# Patient Record
Sex: Female | Born: 1960
Health system: Southern US, Community
[De-identification: ages and names within clinical notes are randomized; demographics above are authoritative.]

## PROBLEM LIST (undated history)

## (undated) DIAGNOSIS — F329 Major depressive disorder, single episode, unspecified: Secondary | ICD-10-CM

## (undated) DIAGNOSIS — M1990 Unspecified osteoarthritis, unspecified site: Secondary | ICD-10-CM

## (undated) DIAGNOSIS — K219 Gastro-esophageal reflux disease without esophagitis: Secondary | ICD-10-CM

## (undated) DIAGNOSIS — F32A Depression, unspecified: Secondary | ICD-10-CM

## (undated) HISTORY — DX: Major depressive disorder, single episode, unspecified: F32.9

## (undated) HISTORY — DX: Depression, unspecified: F32.A

## (undated) HISTORY — PX: COLONOSCOPY: SHX174

## (undated) HISTORY — DX: Unspecified osteoarthritis, unspecified site: M19.90

## (undated) HISTORY — DX: Gastro-esophageal reflux disease without esophagitis: K21.9

---
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
## 2007-09-07 HISTORY — PX: ABDOMINAL HYSTERECTOMY: SHX81

## 2010-11-25 LAB — HM COLONOSCOPY

## 2012-10-29 ENCOUNTER — Emergency Department (INDEPENDENT_AMBULATORY_CARE_PROVIDER_SITE_OTHER): Payer: 59

## 2012-10-29 ENCOUNTER — Emergency Department (INDEPENDENT_AMBULATORY_CARE_PROVIDER_SITE_OTHER)
Admission: EM | Admit: 2012-10-29 | Discharge: 2012-10-29 | Disposition: A | Discharge status: Home / Self Care | Payer: 59 | Source: Home / Self Care

## 2012-10-29 ENCOUNTER — Emergency Department (HOSPITAL_COMMUNITY): Payer: 59

## 2012-10-29 ENCOUNTER — Encounter (HOSPITAL_COMMUNITY): Payer: Self-pay | Admitting: Emergency Medicine

## 2012-10-29 DIAGNOSIS — S5291XA Unspecified fracture of right forearm, initial encounter for closed fracture: Secondary | ICD-10-CM

## 2012-10-29 DIAGNOSIS — S5290XA Unspecified fracture of unspecified forearm, initial encounter for closed fracture: Secondary | ICD-10-CM

## 2012-10-29 DIAGNOSIS — S5292XA Unspecified fracture of left forearm, initial encounter for closed fracture: Secondary | ICD-10-CM

## 2012-10-29 MED ORDER — HYDROCODONE-ACETAMINOPHEN 7.5-500 MG PO TABS: 1.0000 | ORAL_TABLET | ORAL | Status: DC | PRN

## 2012-10-29 NOTE — ED Provider Notes (Signed)
History     CSN: [id number]  Arrival date & time 10/29/12  1114   First MD Initiated Contact with Patient 10/29/12 1118      Chief Complaint  Patient presents with  . Wrist Injury    fell while skating.     (Consider location/radiation/quality/duration/timing/severity/associated sxs/prior treatment) Patient is a 52 y.o. female presenting with wrist injury.  Wrist Injury  This is a 52 year old female who fell while rollerskating last night onto outstretched hands. The patient is noticing pain in both wrists today and difficulty with moving her wrist. She has not tried any medications to help alleviate the pain. The pain is worse in her right breast than her left. No other associated symptoms are present. History reviewed. No pertinent past medical history.  Past Surgical History  Procedure Laterality Date  . Abdominal hysterectomy      History reviewed. No pertinent family history.  History  Substance Use Topics  . Smoking status: Never Smoker   . Smokeless tobacco: Not on file  . Alcohol Use: No    OB History   Grav Para Term Preterm Abortions TAB SAB Ect Mult Living                  Review of Systems  Constitutional: Negative.   HENT: Negative.   Respiratory: Negative.   Cardiovascular: Negative.   Gastrointestinal: Negative.   Musculoskeletal:       History of present illness-pain in bilateral wrists.  Skin: Negative.   Neurological: Negative.   Hematological: Negative.   Psychiatric/Behavioral: Negative.     Allergies  Codeine  Home Medications   Current Outpatient Rx  Name  Route  Sig  Dispense  Refill  . HYDROcodone-acetaminophen (LORTAB 7.5) 7.5-500 MG per tablet   Oral   Take 1 tablet by mouth every 4 (four) hours as needed for pain.   30 tablet   0     BP 119/73  Pulse 85  Temp(Src) 98.5 F (36.9 C) (Oral)  Resp 19  SpO2 100%  Physical Exam  Constitutional: She is oriented to person, place, and time. She appears well-developed and  well-nourished.  HENT:  Head: Normocephalic and atraumatic.  Eyes: Pupils are equal, round, and reactive to light.  Neck: Normal range of motion.  Cardiovascular: Normal rate and regular rhythm.   Pulmonary/Chest: Effort normal and breath sounds normal.  Abdominal: Bowel sounds are normal.  Musculoskeletal:  Tenderness in the medial aspect of both wrists with excess relation of pain on lateral flexion of the hand and flexion and extension of the thumb- mild swelling at the base of the right hand medial aspect where it joins with the wrist  Neurological: She is alert and oriented to person, place, and time.  Skin: Skin is warm and dry.  Psychiatric: She has a normal mood and affect.    ED Course  Procedures (including critical care time)  Labs Reviewed - No data to display Dg Wrist Complete Left  10/29/2012  *RADIOLOGY REPORT*  Clinical Data: Bilateral wrist pain post injury  LEFT WRIST - COMPLETE 3+ VIEW  Comparison: Right wrist same day  Findings: Four views of the left wrist submitted. There is cortical step-off in distal left radius  in  lateral view.  This is highly suspicious for subtle impacted fracture.  Clinical correlation is necessary.  Further evaluation with CT scan could be performed as clinically warranted.  IMPRESSION: There is cortical step-off in distal left radius  in  lateral view. This is  highly suspicious for subtle impacted fracture.  Clinical correlation is necessary.  Further evaluation with CT scan could be performed as clinically warranted.   Original Report Authenticated By: Natasha Mead, M.D.    Dg Wrist Complete Right  10/29/2012  *RADIOLOGY REPORT*  Clinical Data: Wrist injury, bilateral wrist pain  RIGHT WRIST - COMPLETE 3+ VIEW  Comparison: None.  Findings: Four views of the right wrist submitted.  There is mild impacted fracture of the distal right radius.  Fracture line is seen extending in the articular surface of the radius.  IMPRESSION: There is mild impacted  fracture of the distal right radius. Fracture line is seen extending in the articular surface of the radius.   Original Report Authenticated By: Natasha Mead, M.D.      1. Radial fracture, left, closed, initial encounter   2. Radial fracture, right, closed, initial encounter       MDM  Bilateral sugar tong splints. Vicodin when necessary for pain. Outpatient referral to Dr. Victorino Dike.        Calvert Cantor, MD 10/29/12 1245

## 2012-10-29 NOTE — ED Notes (Signed)
Waiting ortho tech then will discharge

## 2012-10-29 NOTE — ED Notes (Signed)
Pt states that around 7:30 she was skating and fell with hands extended outward injuring both wrist.  Pt has limited ROM with right wrist and its more painful. Some pain in left wrist.

## 2013-05-24 ENCOUNTER — Ambulatory Visit (INDEPENDENT_AMBULATORY_CARE_PROVIDER_SITE_OTHER): Payer: 59 | Admitting: Licensed Clinical Social Worker

## 2013-05-24 DIAGNOSIS — F321 Major depressive disorder, single episode, moderate: Secondary | ICD-10-CM

## 2013-05-28 ENCOUNTER — Ambulatory Visit (INDEPENDENT_AMBULATORY_CARE_PROVIDER_SITE_OTHER): Payer: 59 | Admitting: Licensed Clinical Social Worker

## 2013-05-28 DIAGNOSIS — F331 Major depressive disorder, recurrent, moderate: Secondary | ICD-10-CM

## 2013-05-30 ENCOUNTER — Ambulatory Visit: Payer: 59 | Admitting: Licensed Clinical Social Worker

## 2013-06-06 ENCOUNTER — Ambulatory Visit (INDEPENDENT_AMBULATORY_CARE_PROVIDER_SITE_OTHER): Payer: 59 | Admitting: Licensed Clinical Social Worker

## 2013-06-06 DIAGNOSIS — F321 Major depressive disorder, single episode, moderate: Secondary | ICD-10-CM

## 2013-06-22 ENCOUNTER — Ambulatory Visit (INDEPENDENT_AMBULATORY_CARE_PROVIDER_SITE_OTHER): Payer: 59 | Admitting: Licensed Clinical Social Worker

## 2013-06-22 DIAGNOSIS — F321 Major depressive disorder, single episode, moderate: Secondary | ICD-10-CM

## 2013-07-13 ENCOUNTER — Ambulatory Visit: Payer: 59 | Admitting: Licensed Clinical Social Worker

## 2013-07-23 ENCOUNTER — Ambulatory Visit (INDEPENDENT_AMBULATORY_CARE_PROVIDER_SITE_OTHER): Payer: 59 | Admitting: Licensed Clinical Social Worker

## 2013-07-23 DIAGNOSIS — F321 Major depressive disorder, single episode, moderate: Secondary | ICD-10-CM

## 2013-08-13 ENCOUNTER — Ambulatory Visit (INDEPENDENT_AMBULATORY_CARE_PROVIDER_SITE_OTHER): Payer: 59 | Admitting: Licensed Clinical Social Worker

## 2013-08-13 DIAGNOSIS — F321 Major depressive disorder, single episode, moderate: Secondary | ICD-10-CM

## 2013-08-27 ENCOUNTER — Ambulatory Visit (INDEPENDENT_AMBULATORY_CARE_PROVIDER_SITE_OTHER): Payer: 59 | Admitting: Licensed Clinical Social Worker

## 2013-08-27 DIAGNOSIS — F321 Major depressive disorder, single episode, moderate: Secondary | ICD-10-CM

## 2013-09-24 ENCOUNTER — Ambulatory Visit (INDEPENDENT_AMBULATORY_CARE_PROVIDER_SITE_OTHER): Payer: 59 | Admitting: Licensed Clinical Social Worker

## 2013-09-24 DIAGNOSIS — F321 Major depressive disorder, single episode, moderate: Secondary | ICD-10-CM

## 2013-10-17 ENCOUNTER — Ambulatory Visit: Payer: 59 | Admitting: Licensed Clinical Social Worker

## 2014-05-29 ENCOUNTER — Ambulatory Visit: Payer: Self-pay | Admitting: Internal Medicine

## 2014-07-09 ENCOUNTER — Ambulatory Visit: Payer: Self-pay | Admitting: Internal Medicine

## 2014-08-21 ENCOUNTER — Ambulatory Visit (INDEPENDENT_AMBULATORY_CARE_PROVIDER_SITE_OTHER): Payer: 59 | Admitting: Internal Medicine

## 2014-08-21 ENCOUNTER — Encounter (INDEPENDENT_AMBULATORY_CARE_PROVIDER_SITE_OTHER): Payer: Self-pay

## 2014-08-21 ENCOUNTER — Encounter: Payer: Self-pay | Admitting: Internal Medicine

## 2014-08-21 VITALS — BP 112/84 | HR 63 | Temp 97.9°F | Ht 68.75 in | Wt 197.0 lb

## 2014-08-21 DIAGNOSIS — K219 Gastro-esophageal reflux disease without esophagitis: Secondary | ICD-10-CM

## 2014-08-21 DIAGNOSIS — Z23 Encounter for immunization: Secondary | ICD-10-CM

## 2014-08-21 DIAGNOSIS — F419 Anxiety disorder, unspecified: Secondary | ICD-10-CM | POA: Insufficient documentation

## 2014-08-21 DIAGNOSIS — F32A Depression, unspecified: Secondary | ICD-10-CM

## 2014-08-21 DIAGNOSIS — F329 Major depressive disorder, single episode, unspecified: Secondary | ICD-10-CM

## 2014-08-21 MED ORDER — OMEPRAZOLE 40 MG PO CPDR: 40.0000 mg | DELAYED_RELEASE_CAPSULE | Freq: Every day | ORAL | Status: DC

## 2014-08-21 MED ORDER — MELOXICAM 15 MG PO TABS: 15.0000 mg | ORAL_TABLET | Freq: Every day | ORAL | Status: DC

## 2014-08-21 NOTE — Assessment & Plan Note (Signed)
Will increase her prilosec to 40 mg daily to see if symptoms improve Ok to take TUMS otc if needed Will check CMET at next visit

## 2014-08-21 NOTE — Assessment & Plan Note (Signed)
Stable on current dose of prozac and klonipin Continue to follow with psychiatry Support offered today Will check CBC and CMET at next visit

## 2014-08-21 NOTE — Progress Notes (Signed)
HPI  Pt presents to the clinic today to establish care. She is transferring care from [facility].  Flu: wants one today Tetanus: ? 2014 LMP: postmenopausal Pap Smear: 2011? Mammogram: 12/2012 Colon Screening: 2012 Vision Screening: yearly Dentist: biannually  GERD: She is having breakthrouh symptoms on the Prilosec 20 mg daily. She knows that certain foods trigger her reflux and she tries to avoid them. She continues to have reflux about 3 days per week. She thinks she may need to increase her dose.  Depression: Chronic but stable She is seeing psychiatriy. She is currently on Prozac and Klonipin. She reports that it is working well. She denies SI/HI.  She does have some concerns today about low back pain. She reports this started many years ago but seems to be getting worse over the last 6 months. She wakes up with back pain every morning. It also hurts if she stands or walks for long periods of time. She denies numbness and tingling down the legs. She describes the pain as a burning sensation. She denies any injury. She has tried Ibuprofen and Aspercream with some relief. She does have a brand new mattress.  Past Medical History  Diagnosis Date  . GERD (gastroesophageal reflux disease)   . Depression     Current Outpatient Prescriptions  Medication Sig Dispense Refill  . clonazePAM (KLONOPIN) 0.5 MG tablet Take 1 mg by mouth daily.   5  . FLUoxetine (PROZAC) 20 MG capsule Take 20 mg by mouth daily.  12  . omeprazole (PRILOSEC) 40 MG capsule Take 40 mg by mouth daily.     No current facility-administered medications for this visit.    Allergies  Allergen Reactions  . Codeine Nausea Only    Family History  Problem Relation Age of Onset  . Arthritis Mother   . Hyperlipidemia Mother   . Heart disease Mother   . Hypertension Mother   . Anxiety disorder Mother   . Diabetes Mother   . Alcohol abuse Father   . Arthritis Father   . Mental illness Father   . Diabetes  Sister   . Diabetes Brother   . Diabetes Maternal Aunt     History   Social History  . Marital Status: Married    Spouse Name: N/A    Number of Children: N/A  . Years of Education: N/A   Occupational History  . Not on file.   Social History Main Topics  . Smoking status: Never Smoker   . Smokeless tobacco: Never Used  . Alcohol Use: No  . Drug Use: No  . Sexual Activity: Yes    Birth Control/ Protection: Condom, Surgical   Other Topics Concern  . Not on file   Social History Narrative    ROS:  Constitutional: Denies fever, malaise, fatigue, headache or abrupt weight changes.  Respiratory: Denies difficulty breathing, shortness of breath, cough or sputum production.   Cardiovascular: Denies chest pain, chest tightness, palpitations or swelling in the hands or feet.  Gastrointestinal: Pt reports reflux. Denies abdominal pain, bloating, constipation, diarrhea or blood in the stool.  GU: Denies frequency, urgency, pain with urination, blood in urine, odor or discharge. Musculoskeletal: Pt reports low back pain. Denies decrease in range of motion, difficulty with gait, or joint pain and swelling.  Skin: Denies redness, rashes, lesions or ulcercations.  Neurological: Denies dizziness, difficulty with memory, difficulty with speech or problems with balance and coordination.   No other specific complaints in a complete review of systems (except  as listed in HPI above).  PE:  BP 112/84 mmHg  Pulse 63  Temp(Src) 97.9 F (36.6 C) (Oral)  Ht 5' 8.75" (1.746 m)  Wt 197 lb (89.359 kg)  BMI 29.31 kg/m2  SpO2 99% Wt Readings from Last 3 Encounters:  08/21/14 197 lb (89.359 kg)    General: Appears her stated age, well developed, well nourished in NAD. Cardiovascular: Normal rate and rhythm. S1,S2 noted.  No murmur, rubs or gallops noted. No carotid bruits noted. Pulmonary/Chest: Normal effort and positive vesicular breath sounds. No respiratory distress. No wheezes, rales or  ronchi noted.  Abdomen: Soft and nontender. Normal bowel sounds, no bruits noted. No distention or masses noted.  Musculoskeletal: Normal flexion, extension and rotation of the spine. No pain with palpation fo the lumbar spine. Pain with palpation of the paraspinal muscles. Strength 5/5 BLE. No difficulty with gait.  Neurological: Alert and oriented.  Psychiatric: Mood and affect normal. Behavior is normal. Judgment and thought content normal.     Assessment and Plan:  Health Maintenance:  She will schedule her mammogram Flu shot today  Low back pain:  Seems muscular Try a heating pad eRx for Mobic daily OK to take tylenol OTC if needed  RTC in 6 months for physical exam

## 2014-08-21 NOTE — Patient Instructions (Signed)
Back Exercises These exercises may help you when beginning to rehabilitate your injury. Your symptoms may resolve with or without further involvement from your physician, physical therapist or athletic trainer. While completing these exercises, remember:   Restoring tissue flexibility helps normal motion to return to the joints. This allows healthier, less painful movement and activity.  An effective stretch should be held for at least 30 seconds.  A stretch should never be painful. You should only feel a gentle lengthening or release in the stretched tissue. STRETCH - Extension, Prone on Elbows   Lie on your stomach on the floor, a bed will be too soft. Place your palms about shoulder width apart and at the height of your head.  Place your elbows under your shoulders. If this is too painful, stack pillows under your chest.  Allow your body to relax so that your hips drop lower and make contact more completely with the floor.  Hold this position for __________ seconds.  Slowly return to lying flat on the floor. Repeat __________ times. Complete this exercise __________ times per day.  RANGE OF MOTION - Extension, Prone Press Ups   Lie on your stomach on the floor, a bed will be too soft. Place your palms about shoulder width apart and at the height of your head.  Keeping your back as relaxed as possible, slowly straighten your elbows while keeping your hips on the floor. You may adjust the placement of your hands to maximize your comfort. As you gain motion, your hands will come more underneath your shoulders.  Hold this position __________ seconds.  Slowly return to lying flat on the floor. Repeat __________ times. Complete this exercise __________ times per day.  RANGE OF MOTION- Quadruped, Neutral Spine   Assume a hands and knees position on a firm surface. Keep your hands under your shoulders and your knees under your hips. You may place padding under your knees for  comfort.  Drop your head and point your tail bone toward the ground below you. This will round out your low back like an angry cat. Hold this position for __________ seconds.  Slowly lift your head and release your tail bone so that your back sags into a large arch, like an old horse.  Hold this position for __________ seconds.  Repeat this until you feel limber in your low back.  Now, find your "sweet spot." This will be the most comfortable position somewhere between the two previous positions. This is your neutral spine. Once you have found this position, tense your stomach muscles to support your low back.  Hold this position for __________ seconds. Repeat __________ times. Complete this exercise __________ times per day.  STRETCH - Flexion, Single Knee to Chest   Lie on a firm bed or floor with both legs extended in front of you.  Keeping one leg in contact with the floor, bring your opposite knee to your chest. Hold your leg in place by either grabbing behind your thigh or at your knee.  Pull until you feel a gentle stretch in your low back. Hold __________ seconds.  Slowly release your grasp and repeat the exercise with the opposite side. Repeat __________ times. Complete this exercise __________ times per day.  STRETCH - Hamstrings, Standing  Stand or sit and extend your right / left leg, placing your foot on a chair or foot stool  Keeping a slight arch in your low back and your hips straight forward.  Lead with your chest and   lean forward at the waist until you feel a gentle stretch in the back of your right / left knee or thigh. (When done correctly, this exercise requires leaning only a small distance.)  Hold this position for __________ seconds. Repeat __________ times. Complete this stretch __________ times per day. STRENGTHENING - Deep Abdominals, Pelvic Tilt   Lie on a firm bed or floor. Keeping your legs in front of you, bend your knees so they are both pointed  toward the ceiling and your feet are flat on the floor.  Tense your lower abdominal muscles to press your low back into the floor. This motion will rotate your pelvis so that your tail bone is scooping upwards rather than pointing at your feet or into the floor.  With a gentle tension and even breathing, hold this position for __________ seconds. Repeat __________ times. Complete this exercise __________ times per day.  STRENGTHENING - Abdominals, Crunches   Lie on a firm bed or floor. Keeping your legs in front of you, bend your knees so they are both pointed toward the ceiling and your feet are flat on the floor. Cross your arms over your chest.  Slightly tip your chin down without bending your neck.  Tense your abdominals and slowly lift your trunk high enough to just clear your shoulder blades. Lifting higher can put excessive stress on the low back and does not further strengthen your abdominal muscles.  Control your return to the starting position. Repeat __________ times. Complete this exercise __________ times per day.  STRENGTHENING - Quadruped, Opposite UE/LE Lift   Assume a hands and knees position on a firm surface. Keep your hands under your shoulders and your knees under your hips. You may place padding under your knees for comfort.  Find your neutral spine and gently tense your abdominal muscles so that you can maintain this position. Your shoulders and hips should form a rectangle that is parallel with the floor and is not twisted.  Keeping your trunk steady, lift your right hand no higher than your shoulder and then your left leg no higher than your hip. Make sure you are not holding your breath. Hold this position __________ seconds.  Continuing to keep your abdominal muscles tense and your back steady, slowly return to your starting position. Repeat with the opposite arm and leg. Repeat __________ times. Complete this exercise __________ times per day. Document Released:  09/10/2005 Document Revised: 11/15/2011 Document Reviewed: 12/05/2008 Exit[employer] Patient Information 2015 [employer]. This information is not intended to replace advice given to you by your health care provider. Make sure you discuss any questions you have with your health care provider.  

## 2014-08-21 NOTE — Progress Notes (Signed)
Pre visit review using our clinic review tool, if applicable. No additional management support is needed unless otherwise documented below in the visit note. 

## 2014-10-28 ENCOUNTER — Encounter (HOSPITAL_COMMUNITY): Payer: Self-pay

## 2014-10-28 ENCOUNTER — Emergency Department (INDEPENDENT_AMBULATORY_CARE_PROVIDER_SITE_OTHER)
Admission: EM | Admit: 2014-10-28 | Discharge: 2014-10-28 | Disposition: A | Discharge status: Home / Self Care | Payer: 59 | Source: Home / Self Care | Attending: Family Medicine | Admitting: Family Medicine

## 2014-10-28 DIAGNOSIS — M5010 Cervical disc disorder with radiculopathy, unspecified cervical region: Secondary | ICD-10-CM

## 2014-10-28 MED ORDER — METHYLPREDNISOLONE 4 MG PO KIT: PACK | ORAL | Status: DC

## 2014-10-28 MED ORDER — KETOROLAC TROMETHAMINE 30 MG/ML IJ SOLN
INTRAMUSCULAR | Status: AC
  Filled 2014-10-28: qty 1

## 2014-10-28 MED ORDER — KETOROLAC TROMETHAMINE 30 MG/ML IJ SOLN
30.0000 mg | Freq: Once | INTRAMUSCULAR | Status: AC
  Administered 2014-10-28: 30 mg via INTRAMUSCULAR

## 2014-10-28 MED ORDER — CYCLOBENZAPRINE HCL 5 MG PO TABS: 5.0000 mg | ORAL_TABLET | Freq: Three times a day (TID) | ORAL | Status: DC | PRN

## 2014-10-28 NOTE — ED Provider Notes (Signed)
CSN: [id number]     Arrival date & time 10/28/14  89 History   First MD Initiated Contact with Patient 10/28/14 1821     Chief Complaint  Patient presents with  . Neck Pain   (Consider location/radiation/quality/duration/timing/severity/associated sxs/prior Treatment) Patient is a 54 y.o. female presenting with neck pain. The history is provided by the patient.  Neck Pain Pain location:  Generalized neck Quality:  Stabbing and shooting Pain radiates to:  L hand Pain severity:  Moderate Onset quality:  Gradual Duration:  2 weeks Progression:  Worsening Chronicity:  Chronic Context comment:  Known ddd, followed by [facility], has home traction, no surgery. Relieved by:  None tried Associated symptoms: no bladder incontinence, no bowel incontinence, no fever, no headaches, no leg pain, no paresis, no tingling and no weakness     Past Medical History  Diagnosis Date  . GERD (gastroesophageal reflux disease)   . Depression    Past Surgical History  Procedure Laterality Date  . Abdominal hysterectomy  2009  . Appendectomy  1978  . Eye surgery Right 1976   Family History  Problem Relation Age of Onset  . Arthritis Mother   . Hyperlipidemia Mother   . Heart disease Mother   . Hypertension Mother   . Anxiety disorder Mother   . Diabetes Mother   . Alcohol abuse Father   . Arthritis Father   . Mental illness Father   . Diabetes Sister   . Diabetes Brother   . Diabetes Maternal Aunt    History  Substance Use Topics  . Smoking status: Never Smoker   . Smokeless tobacco: Never Used  . Alcohol Use: No   OB History    No data available     Review of Systems  Constitutional: Negative.  Negative for fever.  Gastrointestinal: Negative for bowel incontinence.  Genitourinary: Negative for bladder incontinence.  Musculoskeletal: Positive for neck pain. Negative for back pain, gait problem and neck stiffness.  Neurological: Negative for tingling, weakness and headaches.     Allergies  Codeine  Home Medications   Prior to Admission medications   Medication Sig Start Date End Date Taking? Authorizing Provider  clonazePAM (KLONOPIN) 0.5 MG tablet Take 1 mg by mouth daily.  07/12/14   Historical Provider, MD  cyclobenzaprine (FLEXERIL) 5 MG tablet Take 1 tablet (5 mg total) by mouth 3 (three) times daily as needed for muscle spasms. 10/28/14   Billy Fischer, MD  FLUoxetine (PROZAC) 20 MG capsule Take 20 mg by mouth daily. 08/10/14   Historical Provider, MD  meloxicam (MOBIC) 15 MG tablet Take 1 tablet (15 mg total) by mouth daily. 08/21/14   Jearld Fenton, NP  methylPREDNISolone (MEDROL DOSEPAK) 4 MG tablet follow package directions, start on tues 10/28/14   Billy Fischer, MD  omeprazole (PRILOSEC) 40 MG capsule Take 1 capsule (40 mg total) by mouth daily. 08/21/14   Jearld Fenton, NP   BP 142/96 mmHg  Pulse 69  Temp(Src) 98.1 F (36.7 C) (Oral)  Resp 18  SpO2 100% Physical Exam  Constitutional: She is oriented to person, place, and time. She appears well-developed and well-nourished.  Musculoskeletal: She exhibits tenderness.       Cervical back: She exhibits decreased range of motion, tenderness, bony tenderness, pain and spasm. She exhibits no swelling, no edema and normal pulse.  Neurological: She is alert and oriented to person, place, and time. She displays normal reflexes. No cranial nerve deficit. Coordination normal.  Skin: Skin  is warm and dry.  Nursing note and vitals reviewed.   ED Course  Procedures (including critical care time) Labs Review Labs Reviewed - No data to display  Imaging Review No results found.   MDM   1. Cervical disc disorder with radiculopathy of cervical region        Billy Fischer, MD 10/28/14 [id number]

## 2014-10-28 NOTE — Discharge Instructions (Signed)
Use medicine as prescribed and see your doctor if further problems

## 2014-10-28 NOTE — ED Notes (Signed)
Patient complains of having a pinched nerve that is affecting her neck Complains of neck pain and pain to her left arm Denies any chest pain Patient states she does have degenerative disc disease Symptoms started about two weeks ago Has a home traction unit but not able to get into it Using OTC medications with little relief

## 2014-10-29 ENCOUNTER — Ambulatory Visit (INDEPENDENT_AMBULATORY_CARE_PROVIDER_SITE_OTHER): Payer: 59 | Admitting: Internal Medicine

## 2014-10-29 ENCOUNTER — Encounter: Payer: Self-pay | Admitting: Internal Medicine

## 2014-10-29 VITALS — BP 120/70 | HR 78 | Temp 97.9°F | Wt 205.0 lb

## 2014-10-29 DIAGNOSIS — M5412 Radiculopathy, cervical region: Secondary | ICD-10-CM

## 2014-10-29 MED ORDER — HYDROCODONE-ACETAMINOPHEN 10-325 MG PO TABS
1.0000 | ORAL_TABLET | Freq: Three times a day (TID) | ORAL | Status: DC | PRN
Start: 2014-10-29 — End: 2015-03-19

## 2014-10-29 NOTE — Patient Instructions (Signed)

## 2014-10-29 NOTE — Progress Notes (Signed)
Subjective:    Patient ID: Katie Townsend, female    DOB: 19-Mar-1961, 54 y.o.   MRN: [id number]  HPI  Pt presents to the clinic today for ER followup of neck pain. She had a noted history of arthritis in the cervical spine. She does follow with an orthopedist but has not seen him in the last 3 months and does not have an upcoming appt. She also has traction at home that helps her, but reports she was unable to get in it. No xrays were done in the ER. She has had a xray of her cervical spine from 1 year ago (done by her orthopedist). She was given a pred pack and flexeril in the ER. She has noticed some improvement in her symptoms, but reports the pain continues to be severe at times. She describes the pain and sharp, shooting and tingling.  Review of Systems      Past Medical History  Diagnosis Date  . GERD (gastroesophageal reflux disease)   . Depression     Current Outpatient Prescriptions  Medication Sig Dispense Refill  . clonazePAM (KLONOPIN) 0.5 MG tablet Take 1 mg by mouth daily.   5  . cyclobenzaprine (FLEXERIL) 5 MG tablet Take 1 tablet (5 mg total) by mouth 3 (three) times daily as needed for muscle spasms. 30 tablet 0  . FLUoxetine (PROZAC) 20 MG capsule Take 20 mg by mouth daily.  12  . meloxicam (MOBIC) 15 MG tablet Take 1 tablet (15 mg total) by mouth daily. 90 tablet 1  . methylPREDNISolone (MEDROL DOSEPAK) 4 MG tablet follow package directions, start on tues 21 tablet 0  . omeprazole (PRILOSEC) 40 MG capsule Take 1 capsule (40 mg total) by mouth daily. 90 capsule 1   No current facility-administered medications for this visit.    Allergies  Allergen Reactions  . Codeine Nausea Only    Family History  Problem Relation Age of Onset  . Arthritis Mother   . Hyperlipidemia Mother   . Heart disease Mother   . Hypertension Mother   . Anxiety disorder Mother   . Diabetes Mother   . Alcohol abuse Father   . Arthritis Father   . Mental illness Father   . Diabetes  Sister   . Diabetes Brother   . Diabetes Maternal Aunt     History   Social History  . Marital Status: Married    Spouse Name: N/A  . Number of Children: N/A  . Years of Education: N/A   Occupational History  . Not on file.   Social History Main Topics  . Smoking status: Never Smoker   . Smokeless tobacco: Never Used  . Alcohol Use: No  . Drug Use: No  . Sexual Activity: Yes    Birth Control/ Protection: Condom, Surgical   Other Topics Concern  . Not on file   Social History Narrative     Constitutional: Denies fever, malaise, fatigue, headache or abrupt weight changes.  Respiratory: Denies difficulty breathing, shortness of breath, cough or sputum production.   Cardiovascular: Denies chest pain, chest tightness, palpitations or swelling in the hands or feet.  Musculoskeletal: Pt reports neck pain. Denies decrease in range of motion, difficulty with gait, or joint swelling.  Skin: Denies redness, rashes, lesions or ulcercations.  Neurological: Pt reports tingling in left arm. Denies dizziness, difficulty with memory, difficulty with speech or problems with balance and coordination.   No other specific complaints in a complete review of systems (except as listed  in HPI above).  Objective:   Physical Exam  BP 120/70 mmHg  Pulse 78  Temp(Src) 97.9 F (36.6 C) (Oral)  Wt 205 lb (92.987 kg)  SpO2 99% Wt Readings from Last 3 Encounters:  10/29/14 205 lb (92.987 kg)  08/21/14 197 lb (89.359 kg)    General: Appears herstated age, well developed, well nourished in NAD. Skin: Warm, dry and intact. No rashes, lesions or ulcerations noted. Cardiovascular: Normal rate and rhythm. S1,S2 noted.  No murmur, rubs or gallops noted. No JVD or BLE edema. No carotid bruits noted. Pulmonary/Chest: Normal effort and positive vesicular breath sounds. No respiratory distress. No wheezes, rales or ronchi noted.  Musculoskeletal: Normal flexion and rotation of the neck. Unable to  extend due to pain. No pain with palpation of the cervical spine. Strength 5/5 BUE.  Neurological: Alert and oriented. Sensation intact to BUE. Coordination normal.       Assessment & Plan:   ER followup for cervical radiculitis:  Hospital notes reviewed No imaging was done Some improvement with flexeril and pred pack-advised her to continue She request something for the severe pain (tramadol does not work for her) Careers information officer given for [employer] 5-325 mg TID prn, # 20 ) refills Advised her to make a follow up appt with her orthopedist, if symptoms persist, will need MRI cervical spine  RTC as needed or if symptoms persist or worsen

## 2014-10-29 NOTE — Progress Notes (Signed)
Pre visit review using our clinic review tool, if applicable. No additional management support is needed unless otherwise documented below in the visit note. 

## 2015-02-07 ENCOUNTER — Telehealth: Payer: Self-pay

## 2015-02-07 NOTE — Telephone Encounter (Signed)
Pt states she has not yet scheduled and she had previous mammogram at [facility]. Phone number given to and requested if she could let us know once she has completed the mammogram so we can make sure we get records

## 2015-02-16 ENCOUNTER — Emergency Department
Admission: EM | Admit: 2015-02-16 | Discharge: 2015-02-16 | Discharge status: Against Medical Advice | Payer: 59 | Attending: Emergency Medicine | Admitting: Emergency Medicine

## 2015-02-16 ENCOUNTER — Emergency Department: Payer: 59

## 2015-02-16 ENCOUNTER — Other Ambulatory Visit: Payer: Self-pay

## 2015-02-16 DIAGNOSIS — R0789 Other chest pain: Secondary | ICD-10-CM | POA: Diagnosis not present

## 2015-02-16 LAB — TROPONIN I: Troponin I: <0.03 ng/mL (ref <0.031)

## 2015-02-16 LAB — BASIC METABOLIC PANEL
ANION GAP: 6 (ref 5–15)
BUN: 14 mg/dL (ref 6–20)
CHLORIDE: 107 mmol/L (ref 101–111)
CO2: 25 mmol/L (ref 22–32)
CREATININE: 0.68 mg/dL (ref 0.44–1.00)
Calcium: 8.3 mg/dL — ABNORMAL LOW (ref 8.9–10.3)
GFR calc Af Amer: >60 mL/min (ref >60)
GFR calc non Af Amer: >60 mL/min (ref >60)
Glucose, Bld: 102 mg/dL — ABNORMAL HIGH (ref 65–99)
Potassium: 3.6 mmol/L (ref 3.5–5.1)
Sodium: 138 mmol/L (ref 135–145)

## 2015-02-16 LAB — CBC
HCT: 38.3 % (ref 35.0–47.0)
Hemoglobin: 12.9 g/dL (ref 12.0–16.0)
MCH: 31.9 pg (ref 26.0–34.0)
MCHC: 33.8 g/dL (ref 32.0–36.0)
MCV: 94.4 fL (ref 80.0–100.0)
Platelets: 218 10*3/uL (ref 150–440)
RBC: 4.05 MIL/uL (ref 3.80–5.20)
RDW: 13 % (ref 11.5–14.5)
WBC: 9.9 10*3/uL (ref 3.6–11.0)

## 2015-02-16 NOTE — ED Notes (Signed)
Pt reports stabbing chest pain starting Friday and worsening. Pt denies other symptoms.

## 2015-02-16 NOTE — ED Notes (Signed)
Pt left AMA. Pt seen leaving the ED.

## 2015-02-16 NOTE — ED Notes (Signed)
Pt presents to ER stating central CP for 3 days. Pt in NAD. Pt states sharp pain. Resp even and unlabored.

## 2015-03-19 ENCOUNTER — Ambulatory Visit (INDEPENDENT_AMBULATORY_CARE_PROVIDER_SITE_OTHER): Payer: 59 | Admitting: Internal Medicine

## 2015-03-19 ENCOUNTER — Encounter: Payer: Self-pay | Admitting: Internal Medicine

## 2015-03-19 VITALS — BP 110/68 | HR 78 | Temp 98.1°F | Wt 203.0 lb

## 2015-03-19 DIAGNOSIS — L989 Disorder of the skin and subcutaneous tissue, unspecified: Secondary | ICD-10-CM | POA: Diagnosis not present

## 2015-03-19 NOTE — Patient Instructions (Signed)
Excision of Skin Lesions  Excision of a skin lesion refers to the removal of a section of skin by making small cuts (incisions) in the skin. This is typically done to remove a cancerous growth (basal cell carcinoma, squamous cell carcinoma, or melanoma) or a noncancerous growth (cyst). It may be done to treat or prevent cancer or infection. It may also be done to improve cosmetic appearance (removal of mole, skin tag).  LET YOUR CAREGIVER KNOW ABOUT:   · Allergies to food or medicine.  · Medicines taken, including vitamins, herbs, eyedrops, over-the-counter medicines, and creams.  · Use of steroids (by mouth or creams).  · Previous problems with anesthetics or numbing medicines.  · History of bleeding problems or blood clots.  · History of any prostheses.  · Previous surgery.  · Other health problems, including diabetes and kidney problems.  · Possibility of pregnancy, if this applies.  RISKS AND COMPLICATIONS   Many complications can be managed. With appropriate treatment and rehabilitation, the following complications are very uncommon:  · Bleeding.  · Infection.  · Scarring.  · Recurrence of cyst or cancer.  · Changes in skin sensation or appearance (discoloration, swelling).  · Reaction to anesthesia.  · Allergic reaction to surgical materials or ointments.  · Damage to nerves, blood vessels, muscles, or other structures.  · Continued pain.  BEFORE THE PROCEDURE   It is important to follow your caregiver's instructions prior to your procedure to avoid complications. Steps before your procedure may include:  · Physical exam, blood tests, other procedures, such as removing a small sample for examination under a microscope (biopsy).  · Your caregiver may review the procedure, the anesthesia being used, and what to expect after the procedure with you.  You may be asked to:  · Stop taking certain medicines, such as blood thinners (including aspirin, clopidogrel, ibuprofen), for several days prior to your  procedure.  · Take certain medicines.  · Stop smoking.  It is a good idea to arrange for a ride home after surgery and to have someone to help you with activities during recovery.  PROCEDURE   There are several excision techniques. The type of excision or surgical technique used will depend on your condition, the location of the lesion, and your overall health. After the lesion is sterilized and a local anesthetic is applied, the following may be performed:  Complete surgical excision  The area to be removed is marked with a pen. Using a small scalpel and scissors, the surgeon gently cuts around and under the lesion until it is completely removed. The lesion is placed in a special fluid and sent to the lab for examination. If necessary, bleeding will be controlled with a device that delivers heat. The edges of the wound are stitched together and a dressing is applied. This procedure may be performed to treat a cancerous growth or noncancerous cyst or lesion. Surgeons commonly perform an elliptical excision, to minimize scarring.  Excision of a cyst  The surgeon makes an incision on the cyst. The entire cyst is removed through the incision. The wound may be closed with a suture (stitch).  Shave excision  During shave excision, the surgeon uses a small blade or loop instrument to shave off the lesion. This may be done to remove a mole or skin tag. The wound is usually left to heal on its own without stitches.  Punch excision  During punch excision, the surgeon uses a small, round tool (like a cookie   cutter) to cut a circle shape out of the skin. The outer edges of the skin are stitched together. This may be done to remove a mole or scar or to perform a biopsy of the lesion.  Mohs micrographic surgery  During Mohs micrographic surgery, layers of the lesion are removed with a scalpel or loop instrument and immediately examined under a microscope until all of the abnormal or cancerous tissue is removed. This procedure is  minimally invasive and ensures the best cosmetic outcome, with removal of as little normal tissue as possible. Mohs is usually done to treat skin cancer, such as basal cell carcinoma or squamous cell carcinoma, particularly on the face and ears.  Antibiotic ointment is applied to the surgical area after each of the procedures listed above, as necessary.  AFTER THE PROCEDURE   How well you heal depends on many factors. Most patients heal quite well with proper techniques and self-care. Scarring will lessen over time.  HOME CARE INSTRUCTIONS   · Take medicines for pain as directed.  · Keep the incision area clean, dry, and protected for at least 48 hours. Change dressings as directed.  · For bleeding, apply gentle but firm pressure to the wound using a folded towel for 20 minutes. Call your caregiver if bleeding does not stop.  · Avoid high-impact exercise and activities until the stitches are removed or the area heals.  · Follow your caregiver's instructions to minimize scarring. Avoid sun exposure until the area has healed. Scarring should lessen over time.  · Follow up with your caregiver as directed. Removal of stitches within 4 to 14 days may be necessary.  Finding out the results of your test  Not all test results are available during your visit. If your test results are not back during the visit, make an appointment with your caregiver to find out the results. Do not assume everything is normal if you have not heard from your caregiver or the medical facility. It is important for you to follow up on all of your test results.  SEEK MEDICAL CARE IF:   · You or your child has an oral temperature above 102° F (38.9° C).  · You develop signs of infection (chills, feeling unwell).  · You notice bleeding, pain, discharge, redness, or swelling at the incision site.  · You notice skin irregularities or changes in sensation.  MAKE SURE YOU:   · Understand these instructions.  · Will watch your condition.  · Will get help  right away if you are not doing well or get worse.  FOR MORE INFORMATION   American Academy of Family Physicians: www.aafp.org  American Academy of Dermatology: www.aad.org  Document Released: 11/17/2009 Document Revised: 11/15/2011 Document Reviewed: 11/17/2009  Exit[employer]® Patient Information ©2015 [employer]. This information is not intended to replace advice given to you by your health care provider. Make sure you discuss any questions you have with your health care provider.

## 2015-03-19 NOTE — Progress Notes (Signed)
Pre visit review using our clinic review tool, if applicable. No additional management support is needed unless otherwise documented below in the visit note. 

## 2015-03-19 NOTE — Progress Notes (Signed)
Subjective:    Patient ID: Katie Townsend, female    DOB: 10-30-1960, 54 y.o.   MRN: [id number]  HPI  Pt presents to the clinic today with c/o a skin lesion on the left side of her face. She noticed this 2-3 months ago. A similar lesion came up just under it 2-3 weeks ago. It is a little red. It has not grown in size. It does not itch or burn. She has not tried anything OTC. She has never had any skin cancer before. She reports that she does not get a lot of sun exposure. She does wear sunscreen.  Review of Systems      Past Medical History  Diagnosis Date  . GERD (gastroesophageal reflux disease)   . Depression     Current Outpatient Prescriptions  Medication Sig Dispense Refill  . clonazePAM (KLONOPIN) 0.5 MG tablet Take 1 mg by mouth daily.   5  . esomeprazole (NEXIUM) 20 MG capsule Take 20 mg by mouth daily at 12 noon.     No current facility-administered medications for this visit.    Allergies  Allergen Reactions  . Codeine Nausea Only    Family History  Problem Relation Age of Onset  . Arthritis Mother   . Hyperlipidemia Mother   . Heart disease Mother   . Hypertension Mother   . Anxiety disorder Mother   . Diabetes Mother   . Alcohol abuse Father   . Arthritis Father   . Mental illness Father   . Diabetes Sister   . Diabetes Brother   . Diabetes Maternal Aunt     History   Social History  . Marital Status: Married    Spouse Name: N/A  . Number of Children: N/A  . Years of Education: N/A   Occupational History  . Not on file.   Social History Main Topics  . Smoking status: Never Smoker   . Smokeless tobacco: Never Used  . Alcohol Use: No  . Drug Use: No  . Sexual Activity: Yes    Birth Control/ Protection: Condom, Surgical   Other Topics Concern  . Not on file   Social History Narrative     Constitutional: Denies fever, malaise, fatigue, headache or abrupt weight changes.  Respiratory: Denies difficulty breathing, shortness of breath,  cough or sputum production.   Cardiovascular: Denies chest pain, chest tightness, palpitations or swelling in the hands or feet.  Skin: Pt reports skin lesion of face. Denies redness, rashes, lesions or ulcercations.    No other specific complaints in a complete review of systems (except as listed in HPI above).  Objective:   Physical Exam   BP 110/68 mmHg  Pulse 78  Temp(Src) 98.1 F (36.7 C) (Oral)  Wt 203 lb (92.08 kg)  SpO2 98% Wt Readings from Last 3 Encounters:  03/19/15 203 lb (92.08 kg)  02/16/15 200 lb (90.719 kg)  10/29/14 205 lb (92.987 kg)    General: Appears her stated age, well developed, well nourished in NAD. Skin: Warm, dry and intact. Small irregularly shaped lesion to bridge of nose on left side. The lesion is raised and scaly. Cardiovascular: Normal rate and rhythm. S1,S2 noted.  No murmur, rubs or gallops noted.  Pulmonary/Chest: Normal effort and positive vesicular breath sounds. No respiratory distress. No wheezes, rales or ronchi noted.    BMET    Component Value Date/Time   NA 138 02/16/2015 0245   K 3.6 02/16/2015 0245   CL 107 02/16/2015 0245   CO2  25 02/16/2015 0245   GLUCOSE 102* 02/16/2015 0245   BUN 14 02/16/2015 0245   CREATININE 0.68 02/16/2015 0245   CALCIUM 8.3* 02/16/2015 0245   GFRNONAA >60 02/16/2015 0245   GFRAA >60 02/16/2015 0245    Lipid Panel  No results found for: CHOL, TRIG, HDL, CHOLHDL, VLDL, LDLCALC  CBC    Component Value Date/Time   WBC 9.9 02/16/2015 0245   RBC 4.05 02/16/2015 0245   HGB 12.9 02/16/2015 0245   HCT 38.3 02/16/2015 0245   PLT 218 02/16/2015 0245   MCV 94.4 02/16/2015 0245   MCH 31.9 02/16/2015 0245   MCHC 33.8 02/16/2015 0245   RDW 13.0 02/16/2015 0245    Hgb A1C No results found for: HGBA1C      Assessment & Plan:   Skin lesion of face:  Will refer to derm for further evaluation and treatment  See Rosaria Ferries on the way out to schedule your appt  RTC as needed or if symptoms persist  or worsen

## 2015-03-31 ENCOUNTER — Telehealth: Payer: Self-pay

## 2015-03-31 NOTE — Telephone Encounter (Signed)
Called and spoke with patient, and notified them that they were due for a Mammogram. Patient states that she already has one scheduled at [facility] next week.

## 2015-09-10 ENCOUNTER — Ambulatory Visit: Payer: Self-pay | Admitting: Internal Medicine

## 2015-09-22 ENCOUNTER — Encounter: Payer: Self-pay | Admitting: Internal Medicine

## 2015-09-22 ENCOUNTER — Ambulatory Visit (INDEPENDENT_AMBULATORY_CARE_PROVIDER_SITE_OTHER): Payer: 59 | Admitting: Internal Medicine

## 2015-09-22 VITALS — BP 118/82 | HR 77 | Temp 98.1°F | Wt 204.0 lb

## 2015-09-22 DIAGNOSIS — R1013 Epigastric pain: Secondary | ICD-10-CM

## 2015-09-22 DIAGNOSIS — R110 Nausea: Secondary | ICD-10-CM | POA: Diagnosis not present

## 2015-09-22 DIAGNOSIS — K219 Gastro-esophageal reflux disease without esophagitis: Secondary | ICD-10-CM | POA: Diagnosis not present

## 2015-09-22 LAB — COMPREHENSIVE METABOLIC PANEL
ALT: 66 U/L — AB (ref 0–35)
AST: 34 U/L (ref 0–37)
Albumin: 4.3 g/dL (ref 3.5–5.2)
Alkaline Phosphatase: 77 U/L (ref 39–117)
BUN: 11 mg/dL (ref 6–23)
CO2: 28 meq/L (ref 19–32)
Calcium: 9.5 mg/dL (ref 8.4–10.5)
Chloride: 103 mEq/L (ref 96–112)
Creatinine, Ser: 0.76 mg/dL (ref 0.40–1.20)
GFR: 84.7 mL/min (ref >60.00)
GLUCOSE: 102 mg/dL — AB (ref 70–99)
POTASSIUM: 4.3 meq/L (ref 3.5–5.1)
Sodium: 140 mEq/L (ref 135–145)
Total Bilirubin: 0.8 mg/dL (ref 0.2–1.2)
Total Protein: 7.3 g/dL (ref 6.0–8.3)

## 2015-09-22 LAB — CBC
HEMATOCRIT: 38.7 % (ref 36.0–46.0)
HEMOGLOBIN: 12.9 g/dL (ref 12.0–15.0)
MCHC: 33.4 g/dL (ref 30.0–36.0)
MCV: 94.2 fl (ref 78.0–100.0)
Platelets: 250 10*3/uL (ref 150.0–400.0)
RBC: 4.11 Mil/uL (ref 3.87–5.11)
RDW: 12.7 % (ref 11.5–15.5)
WBC: 7.7 10*3/uL (ref 4.0–10.5)

## 2015-09-22 LAB — LIPASE: Lipase: 16 U/L (ref 11.0–59.0)

## 2015-09-22 LAB — AMYLASE: Amylase: 48 U/L (ref 27–131)

## 2015-09-22 LAB — H. PYLORI ANTIBODY, IGG: H PYLORI IGG: NEGATIVE

## 2015-09-22 NOTE — Progress Notes (Signed)
Pre visit review using our clinic review tool, if applicable. No additional management support is needed unless otherwise documented below in the visit note. 

## 2015-09-22 NOTE — Patient Instructions (Signed)

## 2015-09-22 NOTE — Progress Notes (Signed)
Subjective:    Patient ID: Katie Townsend, female    DOB: 1961-03-22, 55 y.o.   MRN: [id number]  HPI  Pt presents to the clinic today with c/o epigastric pain. She describes the pain as dull and achy. It sometimes radiates under her right breast and to her back. She has had some mild nausea but denies vomiting. The pain is better after she eats. She is having regular BM's, but reports her stool can be loose at times. She has not noticed any blood in her stool or noticed that her stool is darker. She denies urinary or vaginal complaints. She denies cough or shortness of breath. She does have a history of reflux, and is taking Nexium as prescribed. She increased her Nexium to 40 mg daily, 2 weeks ago without relief. She is also taking Mylanta OTC. She has had H Pylori in the past.    Review of Systems  Past Medical History  Diagnosis Date  . GERD (gastroesophageal reflux disease)   . Depression     Current Outpatient Prescriptions  Medication Sig Dispense Refill  . clonazePAM (KLONOPIN) 0.5 MG tablet Take 1 mg by mouth daily.   5  . esomeprazole (NEXIUM) 20 MG capsule Take 20 mg by mouth daily at 12 noon.     No current facility-administered medications for this visit.    Allergies  Allergen Reactions  . Codeine Nausea Only    Family History  Problem Relation Age of Onset  . Arthritis Mother   . Hyperlipidemia Mother   . Heart disease Mother   . Hypertension Mother   . Anxiety disorder Mother   . Diabetes Mother   . Alcohol abuse Father   . Arthritis Father   . Mental illness Father   . Diabetes Sister   . Diabetes Brother   . Diabetes Maternal Aunt     Social History   Social History  . Marital Status: Married    Spouse Name: N/A  . Number of Children: N/A  . Years of Education: N/A   Occupational History  . Not on file.   Social History Main Topics  . Smoking status: Never Smoker   . Smokeless tobacco: Never Used  . Alcohol Use: No  . Drug Use: No  .  Sexual Activity: Yes    Birth Control/ Protection: Condom, Surgical   Other Topics Concern  . Not on file   Social History Narrative     Constitutional: Denies fever, malaise, fatigue, headache or abrupt weight changes.  HEENT: Denies eye pain, eye redness, ear pain, ringing in the ears, wax buildup, runny nose, nasal congestion, bloody nose, or sore throat. Respiratory: Denies difficulty breathing, shortness of breath, cough or sputum production.   Cardiovascular: Denies chest pain, chest tightness, palpitations or swelling in the hands or feet.  Gastrointestinal: Pt reports epigastric pain. Denies bloating, constipation, diarrhea or blood in the stool.  GU: Denies urgency, frequency, pain with urination, burning sensation, blood in urine, odor or discharge.  No other specific complaints in a complete review of systems (except as listed in HPI above).     Objective:   Physical Exam   BP 118/82 mmHg  Pulse 77  Temp(Src) 98.1 F (36.7 C) (Oral)  Wt 204 lb (92.534 kg)  SpO2 98% Wt Readings from Last 3 Encounters:  09/22/15 204 lb (92.534 kg)  03/19/15 203 lb (92.08 kg)  02/16/15 200 lb (90.719 kg)    General: Appears her stated age, obese in NAD. Cardiovascular: Normal  rate and rhythm. S1,S2 noted.  No murmur, rubs or gallops noted.  Pulmonary/Chest: Normal effort and positive vesicular breath sounds. No respiratory distress. No wheezes, rales or ronchi noted.  Abdomen: Soft and tender in the epigastric, RUQ area. Normal bowel sounds. No distention or masses noted. Liver, spleen and kidneys non palpable. Negative Murphy's sign. Musculoskeletal: Normal flexion, extension and rotation of the spine. No bony tenderness noted. No pain with palpation of the muscles of the back. No CVA tenderness.  Neurological: Alert and oriented.   BMET    Component Value Date/Time   NA 138 02/16/2015 0245   K 3.6 02/16/2015 0245   CL 107 02/16/2015 0245   CO2 25 02/16/2015 0245   GLUCOSE  102* 02/16/2015 0245   BUN 14 02/16/2015 0245   CREATININE 0.68 02/16/2015 0245   CALCIUM 8.3* 02/16/2015 0245   GFRNONAA >60 02/16/2015 0245   GFRAA >60 02/16/2015 0245    Lipid Panel  No results found for: CHOL, TRIG, HDL, CHOLHDL, VLDL, LDLCALC  CBC    Component Value Date/Time   WBC 9.9 02/16/2015 0245   RBC 4.05 02/16/2015 0245   HGB 12.9 02/16/2015 0245   HCT 38.3 02/16/2015 0245   PLT 218 02/16/2015 0245   MCV 94.4 02/16/2015 0245   MCH 31.9 02/16/2015 0245   MCHC 33.8 02/16/2015 0245   RDW 13.0 02/16/2015 0245    Hgb A1C No results found for: HGBA1C      Assessment & Plan:   Epigastric and RUQ pain with nausea:  ? Ulcer versus gallbladder disease Will check CBC, CMET, Lipase, Amylase and H Pylori today Continue Nexium 40 mg daily Avoid NSAIDS Will call you once labs are back, consider referral to GI for upper GI  RTC as needed or if symptoms persist or worsen

## 2015-09-24 ENCOUNTER — Other Ambulatory Visit: Payer: Self-pay | Admitting: Internal Medicine

## 2015-09-24 DIAGNOSIS — R1011 Right upper quadrant pain: Secondary | ICD-10-CM

## 2015-10-01 ENCOUNTER — Ambulatory Visit
Admission: RE | Admit: 2015-10-01 | Discharge: 2015-10-01 | Disposition: A | Discharge status: Home / Self Care | Payer: 59 | Source: Ambulatory Visit | Attending: Internal Medicine | Admitting: Internal Medicine

## 2015-10-01 DIAGNOSIS — R1011 Right upper quadrant pain: Secondary | ICD-10-CM

## 2015-12-31 ENCOUNTER — Encounter: Payer: Self-pay | Admitting: Internal Medicine

## 2015-12-31 ENCOUNTER — Ambulatory Visit (INDEPENDENT_AMBULATORY_CARE_PROVIDER_SITE_OTHER): Payer: 59 | Admitting: Internal Medicine

## 2015-12-31 VITALS — BP 132/84 | HR 83 | Temp 98.3°F | Wt 196.0 lb

## 2015-12-31 DIAGNOSIS — F329 Major depressive disorder, single episode, unspecified: Secondary | ICD-10-CM

## 2015-12-31 DIAGNOSIS — F418 Other specified anxiety disorders: Secondary | ICD-10-CM | POA: Diagnosis not present

## 2015-12-31 DIAGNOSIS — F419 Anxiety disorder, unspecified: Secondary | ICD-10-CM

## 2015-12-31 DIAGNOSIS — F32A Depression, unspecified: Secondary | ICD-10-CM

## 2015-12-31 LAB — LUTEINIZING HORMONE: LH: 20.31 m[IU]/mL

## 2015-12-31 LAB — FOLLICLE STIMULATING HORMONE: FSH: 56.9 m[IU]/mL

## 2015-12-31 LAB — TSH: TSH: 1.29 u[IU]/mL (ref 0.35–4.50)

## 2015-12-31 MED ORDER — PAROXETINE HCL 10 MG PO TABS: 10.0000 mg | ORAL_TABLET | Freq: Every day | ORAL | Status: DC

## 2015-12-31 NOTE — Assessment & Plan Note (Signed)
Deteriorated Discussed treatment options- she wants to try Paxil- RX sent to pharmacy Ok to continue Clonazepam prn She is not interested in seeing psychiatry at this time Will check TSH, FSH and LH today  RTC in 4 weeks to follow up

## 2015-12-31 NOTE — Progress Notes (Signed)
Subjective:    Patient ID: Katie Townsend, female    DOB: [birth year], 55 y.o.   MRN: [id number]  HPI  Pt presents to the clinic today with c/o worsening anxiety and depression.  She reports over the last month, her anxiety and depression has gotten worse. She is crying all the time, she is fatigued and has no energy. All she wants to do is sleep. She is so anxious that she feels like it is interfering with her work. She stopped seeing psychiatry 6-8 months ago. She stopped Prozac 6 months ago because it caused weight gain and she felt like it was making her worse. She is taking the Clonazepam, a few times a week for the last few weeks. She has also tried and failed Celexa, Zoloft, and Pristiq. She is concerned that she may be going through menopause and would like her hormone levels checked. She has noticed fatigue, weight gain, hot flashes and mood swings.  Review of Systems      Past Medical History  Diagnosis Date  . GERD (gastroesophageal reflux disease)   . Depression     Current Outpatient Prescriptions  Medication Sig Dispense Refill  . clonazePAM (KLONOPIN) 0.5 MG tablet Take 1 mg by mouth daily.   5  . diphenhydrAMINE (BENADRYL) 25 MG tablet Take 25 mg by mouth at bedtime as needed.    Marland Kitchen esomeprazole (NEXIUM) 20 MG capsule Take 40 mg by mouth daily at 12 noon.      No current facility-administered medications for this visit.    Allergies  Allergen Reactions  . Codeine Nausea Only    Family History  Problem Relation Age of Onset  . Arthritis Mother   . Hyperlipidemia Mother   . Heart disease Mother   . Hypertension Mother   . Anxiety disorder Mother   . Diabetes Mother   . Alcohol abuse Father   . Arthritis Father   . Mental illness Father   . Diabetes Sister   . Diabetes Brother   . Diabetes Maternal Aunt     Social History   Social History  . Marital Status: Married    Spouse Name: N/A  . Number of Children: N/A  . Years of Education: N/A    Occupational History  . Not on file.   Social History Main Topics  . Smoking status: Never Smoker   . Smokeless tobacco: Never Used  . Alcohol Use: No  . Drug Use: No  . Sexual Activity: Yes    Birth Control/ Protection: Condom, Surgical   Other Topics Concern  . Not on file   Social History Narrative     Constitutional: Pt reports fatigue and weight gain. Denies fever, malaise, headache.  Respiratory: Denies difficulty breathing, shortness of breath, cough or sputum production.   Cardiovascular: Denies chest pain, chest tightness, palpitations or swelling in the hands or feet.  Neurological: Denies dizziness, difficulty with memory, difficulty with speech or problems with balance and coordination.  Psych: pt reports anxiety and depression. Denies SI/HI.  No other specific complaints in a complete review of systems (except as listed in HPI above).  Objective:   Physical Exam   BP 132/84 mmHg  Pulse 83  Temp(Src) 98.3 F (36.8 C) (Oral)  Wt 196 lb (88.905 kg)  SpO2 99% Wt Readings from Last 3 Encounters:  12/31/15 196 lb (88.905 kg)  09/22/15 204 lb (92.534 kg)  03/19/15 203 lb (92.08 kg)    General: Appears her stated age, obese in NAD.  Neck:  Neck supple, trachea midline. No masses, lumps or thyromegaly present.  Cardiovascular: Normal rate and rhythm. S1,S2 noted.  No murmur, rubs or gallops noted.  Pulmonary/Chest: Normal effort and positive vesicular breath sounds. No respiratory distress. No wheezes, rales or ronchi noted.  Neurological: Alert and oriented.  Psychiatric: She is tearful and anxious. Her thoughts are scattered.  BMET    Component Value Date/Time   NA 140 09/22/2015 1437   K 4.3 09/22/2015 1437   CL 103 09/22/2015 1437   CO2 28 09/22/2015 1437   GLUCOSE 102* 09/22/2015 1437   BUN 11 09/22/2015 1437   CREATININE 0.76 09/22/2015 1437   CALCIUM 9.5 09/22/2015 1437   GFRNONAA >60 02/16/2015 0245   GFRAA >60 02/16/2015 0245    Lipid  Panel  No results found for: CHOL, TRIG, HDL, CHOLHDL, VLDL, LDLCALC  CBC    Component Value Date/Time   WBC 7.7 09/22/2015 1437   RBC 4.11 09/22/2015 1437   HGB 12.9 09/22/2015 1437   HCT 38.7 09/22/2015 1437   PLT 250.0 09/22/2015 1437   MCV 94.2 09/22/2015 1437   MCH 31.9 02/16/2015 0245   MCHC 33.4 09/22/2015 1437   RDW 12.7 09/22/2015 1437    Hgb A1C No results found for: HGBA1C      Assessment & Plan:

## 2015-12-31 NOTE — Progress Notes (Signed)
Pre visit review using our clinic review tool, if applicable. No additional management support is needed unless otherwise documented below in the visit note. 

## 2015-12-31 NOTE — Patient Instructions (Signed)
Generalized Anxiety Disorder Generalized anxiety disorder (GAD) is a mental disorder. It interferes with life functions, including relationships, work, and school. GAD is different from normal anxiety, which everyone experiences at some point in their lives in response to specific life events and activities. Normal anxiety actually helps us prepare for and get through these life events and activities. Normal anxiety goes away after the event or activity is over.  GAD causes anxiety that is not necessarily related to specific events or activities. It also causes excess anxiety in proportion to specific events or activities. The anxiety associated with GAD is also difficult to control. GAD can vary from mild to severe. People with severe GAD can have intense waves of anxiety with physical symptoms (panic attacks).  SYMPTOMS The anxiety and worry associated with GAD are difficult to control. This anxiety and worry are related to many life events and activities and also occur more days than not for 6 months or longer. People with GAD also have three or more of the following symptoms (one or more in children):  Restlessness.   Fatigue.  Difficulty concentrating.   Irritability.  Muscle tension.  Difficulty sleeping or unsatisfying sleep. DIAGNOSIS GAD is diagnosed through an assessment by your health care provider. Your health care provider will ask you questions aboutyour mood,physical symptoms, and events in your life. Your health care provider may ask you about your medical history and use of alcohol or drugs, including prescription medicines. Your health care provider may also do a physical exam and blood tests. Certain medical conditions and the use of certain substances can cause symptoms similar to those associated with GAD. Your health care provider may refer you to a mental health specialist for further evaluation. TREATMENT The following therapies are usually used to treat GAD:    Medication. Antidepressant medication usually is prescribed for long-term daily control. Antianxiety medicines may be added in severe cases, especially when panic attacks occur.   Talk therapy (psychotherapy). Certain types of talk therapy can be helpful in treating GAD by providing support, education, and guidance. A form of talk therapy called cognitive behavioral therapy can teach you healthy ways to think about and react to daily life events and activities.  Stress managementtechniques. These include yoga, meditation, and exercise and can be very helpful when they are practiced regularly. A mental health specialist can help determine which treatment is best for you. Some people see improvement with one therapy. However, other people require a combination of therapies.   This information is not intended to replace advice given to you by your health care provider. Make sure you discuss any questions you have with your health care provider.   Document Released: 12/18/2012 Document Revised: 09/13/2014 Document Reviewed: 12/18/2012 Elsevier Interactive Patient Education 2016 [employer]  

## 2016-06-08 ENCOUNTER — Telehealth: Payer: Self-pay | Admitting: Internal Medicine

## 2016-06-08 NOTE — Telephone Encounter (Signed)
Pt has appt with R Baity NP on 06/09/16 at 1 pm.

## 2016-06-08 NOTE — Telephone Encounter (Signed)
Patient Name: Katie Townsend  DOB: Oct 20, 1960    Initial Comment Caller states she has been having a lot of pain in abdomen near her bladder. Wondering if she can come in to the office to drop a urine specimen.    Nurse Assessment  Nurse: Thad Ranger RN, Denise Date/Time (Eastern Time): 06/08/2016 3:32:24 PM  Confirm and document reason for call. If symptomatic, describe symptoms. You must click the next button to save text entered. ---Caller states she has been having a lot of pain in abdomen near her bladder. Wondering if she can come in to the office to drop a urine specimen. Denies dysuria, freq/urg, fever, blood in urine, odor to urine or discolored urine, flank or lower back/side pain. States she just has pain in the lower abd over the bladder area.  Has the patient traveled out of the country within the last 30 days? ---Not Applicable  Does the patient have any new or worsening symptoms? ---Yes  Will a triage be completed? ---Yes  Related visit to physician within the last 2 weeks? ---No  Does the PT have any chronic conditions? (i.e. diabetes, asthma, etc.) ---No  Is the patient pregnant or possibly pregnant? (Ask all females between the ages of 45-55) ---No  Is this a behavioral health or substance abuse call? ---No     Guidelines    Guideline Title Affirmed Question Affirmed Notes  Abdominal Pain - Female [1] MODERATE (e.g., interferes with normal activities) AND [2] pain comes and goes (cramps) AND [3] present > 24 hours (Exception: pain with Vomiting or Diarrhea - see that Guideline)    Final Disposition User   See Physician within 24 Hours Carmon, RN, [employer]    Referrals  REFERRED TO PCP OFFICE   Disagree/Comply: Leta Baptist

## 2016-06-09 ENCOUNTER — Encounter: Payer: Self-pay | Admitting: Internal Medicine

## 2016-06-09 ENCOUNTER — Ambulatory Visit (INDEPENDENT_AMBULATORY_CARE_PROVIDER_SITE_OTHER): Payer: 59 | Admitting: Internal Medicine

## 2016-06-09 VITALS — BP 128/82 | HR 69 | Temp 98.4°F | Wt 196.5 lb

## 2016-06-09 DIAGNOSIS — R102 Pelvic and perineal pain: Secondary | ICD-10-CM

## 2016-06-09 DIAGNOSIS — Z23 Encounter for immunization: Secondary | ICD-10-CM

## 2016-06-09 DIAGNOSIS — R1031 Right lower quadrant pain: Secondary | ICD-10-CM

## 2016-06-09 LAB — POC URINALSYSI DIPSTICK (AUTOMATED)
BILIRUBIN UA: NEGATIVE
Blood, UA: NEGATIVE
GLUCOSE UA: NEGATIVE
Ketones, UA: NEGATIVE
LEUKOCYTES UA: NEGATIVE
NITRITE UA: NEGATIVE
Protein, UA: NEGATIVE
Spec Grav, UA: 1.02
Urobilinogen, UA: NEGATIVE
pH, UA: 6

## 2016-06-09 NOTE — Addendum Note (Signed)
Addended by: Lurlean Nanny on: 06/09/2016 03:43 PM   Modules accepted: Orders

## 2016-06-09 NOTE — Patient Instructions (Signed)

## 2016-06-09 NOTE — Progress Notes (Signed)
Subjective:    Patient ID: Katie Townsend, female    DOB: Jul 24, 1961, 55 y.o.   MRN: [id number]  HPI  Pt presents to the clinic today with c/o lower abdominal pain. She reports this started 2-3 weeks ago. It has been intermittent. She describes the pain as a dull ache, mostly over the bladder. Her pain seems worse when her bladder is full. She denies urgency, frequency, dysuria, blood in her urine or urine odor. She denies nausea, vomiting, constipation, blood in stool or changes in bowel habits. She has had a partial hysterectomy in 2009. She has a history of GERD but reports this is different. She has taken Ibuprofen with some relief of her pain.  She would like to get her flu shot today.  Review of Systems      Past Medical History:  Diagnosis Date  . Depression   . GERD (gastroesophageal reflux disease)     Current Outpatient Prescriptions  Medication Sig Dispense Refill  . clonazePAM (KLONOPIN) 0.5 MG tablet Take 1 mg by mouth daily.   5  . diphenhydrAMINE (BENADRYL) 25 MG tablet Take 25 mg by mouth at bedtime as needed.    Marland Kitchen esomeprazole (NEXIUM) 20 MG capsule Take 40 mg by mouth daily at 12 noon.      No current facility-administered medications for this visit.     Allergies  Allergen Reactions  . Codeine Nausea Only    Family History  Problem Relation Age of Onset  . Arthritis Mother   . Hyperlipidemia Mother   . Heart disease Mother   . Hypertension Mother   . Anxiety disorder Mother   . Diabetes Mother   . Alcohol abuse Father   . Arthritis Father   . Mental illness Father   . Diabetes Sister   . Diabetes Brother   . Diabetes Maternal Aunt     Social History   Social History  . Marital status: Married    Spouse name: N/A  . Number of children: N/A  . Years of education: N/A   Occupational History  . Not on file.   Social History Main Topics  . Smoking status: Never Smoker  . Smokeless tobacco: Never Used  . Alcohol use No  . Drug use: No  .  Sexual activity: Yes    Birth control/ protection: Condom, Surgical   Other Topics Concern  . Not on file   Social History Narrative  . No narrative on file     Constitutional: Denies fever, malaise, fatigue, headache or abrupt weight changes.  Cardiovascular: Denies chest pain, chest tightness, palpitations or swelling in the hands or feet.  Gastrointestinal: Pt reports lower abdominal pain. Denies bloating, constipation, diarrhea or blood in the stool.  GU: Denies urgency, frequency, pain with urination, burning sensation, blood in urine, odor or discharge.  No other specific complaints in a complete review of systems (except as listed in HPI above).  Objective:   Physical Exam   BP 128/82   Pulse 69   Temp 98.4 F (36.9 C) (Oral)   Wt 196 lb 8 oz (89.1 kg)   SpO2 99%   BMI 29.02 kg/m  Wt Readings from Last 3 Encounters:  06/09/16 196 lb 8 oz (89.1 kg)  12/31/15 196 lb (88.9 kg)  09/22/15 204 lb (92.5 kg)    General: Appears her stated age,  in NAD. Cardiovascular: Normal rate and rhythm. S1,S2 noted.  No murmur, rubs or gallops noted.  Abdomen: Soft and tender in the  RLQ and suprapubic area. Normal bowel sounds. No distention or masses noted. Liver, spleen and kidneys non palpable. Pelvic: Normal female anatomy. No evidence of cystocele. Very mild rectocele. Cervix absent. Adnexa non palpable.  BMET    Component Value Date/Time   NA 140 09/22/2015 1437   K 4.3 09/22/2015 1437   CL 103 09/22/2015 1437   CO2 28 09/22/2015 1437   GLUCOSE 102 (H) 09/22/2015 1437   BUN 11 09/22/2015 1437   CREATININE 0.76 09/22/2015 1437   CALCIUM 9.5 09/22/2015 1437   GFRNONAA >60 02/16/2015 0245   GFRAA >60 02/16/2015 0245    Lipid Panel  No results found for: CHOL, TRIG, HDL, CHOLHDL, VLDL, LDLCALC  CBC    Component Value Date/Time   WBC 7.7 09/22/2015 1437   RBC 4.11 09/22/2015 1437   HGB 12.9 09/22/2015 1437   HCT 38.7 09/22/2015 1437   PLT 250.0 09/22/2015 1437    MCV 94.2 09/22/2015 1437   MCH 31.9 02/16/2015 0245   MCHC 33.4 09/22/2015 1437   RDW 12.7 09/22/2015 1437    Hgb A1C No results found for: HGBA1C         Assessment & Plan:   Suprapubic/RLQ abdominal pain:  Urinalysis: normal. Pelvic exam normal Since this seems to be getting worse, will assess for ovarian cause Ordered Pelvic/Transvaginal ultrasound- see Rosaria Ferries on the way out to schedule Continue Ibuprofen Will follow up with you after test results are back  Flu shot given today  Make an appt for an annual exam Webb Silversmith, NP

## 2016-06-18 ENCOUNTER — Ambulatory Visit
Admission: RE | Admit: 2016-06-18 | Discharge: 2016-06-18 | Disposition: A | Discharge status: Home / Self Care | Payer: 59 | Source: Ambulatory Visit | Attending: Internal Medicine | Admitting: Internal Medicine

## 2016-06-18 DIAGNOSIS — R102 Pelvic and perineal pain: Secondary | ICD-10-CM

## 2016-06-18 DIAGNOSIS — R1031 Right lower quadrant pain: Secondary | ICD-10-CM

## 2016-08-16 ENCOUNTER — Encounter: Payer: Self-pay | Admitting: Internal Medicine

## 2016-08-16 MED ORDER — CLONAZEPAM 0.5 MG PO TABS: 1.0000 mg | ORAL_TABLET | Freq: Every day | ORAL | 0 refills | Status: DC

## 2016-08-16 NOTE — Telephone Encounter (Signed)
I do not see where you have ever filled this medication.... please advise 

## 2016-08-16 NOTE — Telephone Encounter (Signed)
Ok to refill Clonazepam? 

## 2016-08-16 NOTE — Telephone Encounter (Signed)
Spoke to patient and was advised that she is not taking Paxil. Patient stated that she had some Clonazepam left over from an old prescription and has been using that. Patient stated that she does not take the medication often. Patient stated that she has not seen psychiatry in over a year.

## 2016-08-16 NOTE — Telephone Encounter (Signed)
Note from 12/2015 reviewed. Is she taking the Paxil still? Who has been filling the Clonazepam over the last 6-8 months.

## 2016-08-16 NOTE — Telephone Encounter (Signed)
Last time we discussed this in 08/2014, she was seeing psychiatry. Is she no longer seeing them? If not, where has she been getting this filled?

## 2016-08-17 NOTE — Telephone Encounter (Signed)
Rx called in to pharmacy. 

## 2017-03-10 DIAGNOSIS — L821 Other seborrheic keratosis: Secondary | ICD-10-CM | POA: Diagnosis not present

## 2017-03-10 DIAGNOSIS — D1801 Hemangioma of skin and subcutaneous tissue: Secondary | ICD-10-CM | POA: Diagnosis not present

## 2017-03-10 DIAGNOSIS — L570 Actinic keratosis: Secondary | ICD-10-CM | POA: Diagnosis not present

## 2017-03-10 DIAGNOSIS — D225 Melanocytic nevi of trunk: Secondary | ICD-10-CM | POA: Diagnosis not present

## 2017-06-03 ENCOUNTER — Encounter: Payer: Self-pay | Admitting: Internal Medicine

## 2017-06-03 ENCOUNTER — Ambulatory Visit (INDEPENDENT_AMBULATORY_CARE_PROVIDER_SITE_OTHER): Payer: 59 | Admitting: Internal Medicine

## 2017-06-03 VITALS — BP 120/82 | HR 72 | Temp 98.2°F | Ht 68.5 in | Wt 200.0 lb

## 2017-06-03 DIAGNOSIS — Z79899 Other long term (current) drug therapy: Secondary | ICD-10-CM | POA: Diagnosis not present

## 2017-06-03 DIAGNOSIS — Z23 Encounter for immunization: Secondary | ICD-10-CM

## 2017-06-03 DIAGNOSIS — Z0000 Encounter for general adult medical examination without abnormal findings: Secondary | ICD-10-CM | POA: Diagnosis not present

## 2017-06-03 DIAGNOSIS — Z1159 Encounter for screening for other viral diseases: Secondary | ICD-10-CM | POA: Diagnosis not present

## 2017-06-03 DIAGNOSIS — K219 Gastro-esophageal reflux disease without esophagitis: Secondary | ICD-10-CM | POA: Diagnosis not present

## 2017-06-03 DIAGNOSIS — Z1231 Encounter for screening mammogram for malignant neoplasm of breast: Secondary | ICD-10-CM | POA: Diagnosis not present

## 2017-06-03 DIAGNOSIS — F419 Anxiety disorder, unspecified: Secondary | ICD-10-CM

## 2017-06-03 DIAGNOSIS — Z114 Encounter for screening for human immunodeficiency virus [HIV]: Secondary | ICD-10-CM

## 2017-06-03 DIAGNOSIS — Z1239 Encounter for other screening for malignant neoplasm of breast: Secondary | ICD-10-CM

## 2017-06-03 LAB — COMPREHENSIVE METABOLIC PANEL
ALBUMIN: 4.3 g/dL (ref 3.5–5.2)
ALK PHOS: 64 U/L (ref 39–117)
ALT: 25 U/L (ref 0–35)
AST: 21 U/L (ref 0–37)
BILIRUBIN TOTAL: 1.2 mg/dL (ref 0.2–1.2)
BUN: 10 mg/dL (ref 6–23)
CO2: 31 mEq/L (ref 19–32)
Calcium: 9.6 mg/dL (ref 8.4–10.5)
Chloride: 100 mEq/L (ref 96–112)
Creatinine, Ser: 0.69 mg/dL (ref 0.40–1.20)
GFR: 94.08 mL/min (ref >60.00)
Glucose, Bld: 102 mg/dL — ABNORMAL HIGH (ref 70–99)
POTASSIUM: 3.8 meq/L (ref 3.5–5.1)
Sodium: 137 mEq/L (ref 135–145)
TOTAL PROTEIN: 7.1 g/dL (ref 6.0–8.3)

## 2017-06-03 LAB — LIPID PANEL
CHOLESTEROL: 218 mg/dL — AB (ref 0–200)
HDL: 43.3 mg/dL (ref >39.00)
LDL Cholesterol: 139 mg/dL — ABNORMAL HIGH (ref 0–99)
NonHDL: 175.04
Total CHOL/HDL Ratio: 5
Triglycerides: 182 mg/dL — ABNORMAL HIGH (ref 0.0–149.0)
VLDL: 36.4 mg/dL (ref 0.0–40.0)

## 2017-06-03 LAB — CBC
HEMATOCRIT: 37.9 % (ref 36.0–46.0)
HEMOGLOBIN: 12.8 g/dL (ref 12.0–15.0)
MCHC: 33.9 g/dL (ref 30.0–36.0)
MCV: 94.7 fl (ref 78.0–100.0)
PLATELETS: 265 10*3/uL (ref 150.0–400.0)
RBC: 4 Mil/uL (ref 3.87–5.11)
RDW: 13.2 % (ref 11.5–15.5)
WBC: 8.6 10*3/uL (ref 4.0–10.5)

## 2017-06-03 LAB — VITAMIN D 25 HYDROXY (VIT D DEFICIENCY, FRACTURES): VITD: 13.48 ng/mL — AB (ref 30.00–100.00)

## 2017-06-03 MED ORDER — CLONAZEPAM 0.5 MG PO TABS: 0.5000 mg | ORAL_TABLET | Freq: Every day | ORAL | 0 refills | Status: DC | PRN

## 2017-06-03 NOTE — Patient Instructions (Signed)
Health Maintenance for Postmenopausal Women Menopause is a normal process in which your reproductive ability comes to an end. This process happens gradually over a span of months to years, usually between the ages of 22 and 9. Menopause is complete when you have missed 12 consecutive menstrual periods. It is important to talk with your health care provider about some of the most common conditions that affect postmenopausal women, such as heart disease, cancer, and bone loss (osteoporosis). Adopting a healthy lifestyle and getting preventive care can help to promote your health and wellness. Those actions can also lower your chances of developing some of these common conditions. What should I know about menopause? During menopause, you may experience a number of symptoms, such as:  Moderate-to-severe hot flashes.  Night sweats.  Decrease in sex drive.  Mood swings.  Headaches.  Tiredness.  Irritability.  Memory problems.  Insomnia.  Choosing to treat or not to treat menopausal changes is an individual decision that you make with your health care provider. What should I know about hormone replacement therapy and supplements? Hormone therapy products are effective for treating symptoms that are associated with menopause, such as hot flashes and night sweats. Hormone replacement carries certain risks, especially as you become older. If you are thinking about using estrogen or estrogen with progestin treatments, discuss the benefits and risks with your health care provider. What should I know about heart disease and stroke? Heart disease, heart attack, and stroke become more likely as you age. This may be due, in part, to the hormonal changes that your body experiences during menopause. These can affect how your body processes dietary fats, triglycerides, and cholesterol. Heart attack and stroke are both medical emergencies. There are many things that you can do to help prevent heart disease  and stroke:  Have your blood pressure checked at least every 1-2 years. High blood pressure causes heart disease and increases the risk of stroke.  If you are 53-22 years old, ask your health care provider if you should take aspirin to prevent a heart attack or a stroke.  Do not use any tobacco products, including cigarettes, chewing tobacco, or electronic cigarettes. If you need help quitting, ask your health care provider.  It is important to eat a healthy diet and maintain a healthy weight. ? Be sure to include plenty of vegetables, fruits, low-fat dairy products, and lean protein. ? Avoid eating foods that are high in solid fats, added sugars, or salt (sodium).  Get regular exercise. This is one of the most important things that you can do for your health. ? Try to exercise for at least 150 minutes each week. The type of exercise that you do should increase your heart rate and make you sweat. This is known as moderate-intensity exercise. ? Try to do strengthening exercises at least twice each week. Do these in addition to the moderate-intensity exercise.  Know your numbers.Ask your health care provider to check your cholesterol and your blood glucose. Continue to have your blood tested as directed by your health care provider.  What should I know about cancer screening? There are several types of cancer. Take the following steps to reduce your risk and to catch any cancer development as early as possible. Breast Cancer  Practice breast self-awareness. ? This means understanding how your breasts normally appear and feel. ? It also means doing regular breast self-exams. Let your health care provider know about any changes, no matter how small.  If you are 40  or older, have a clinician do a breast exam (clinical breast exam or CBE) every year. Depending on your age, family history, and medical history, it may be recommended that you also have a yearly breast X-ray (mammogram).  If you  have a family history of breast cancer, talk with your health care provider about genetic screening.  If you are at high risk for breast cancer, talk with your health care provider about having an MRI and a mammogram every year.  Breast cancer (BRCA) gene test is recommended for women who have family members with BRCA-related cancers. Results of the assessment will determine the need for genetic counseling and BRCA1 and for BRCA2 testing. BRCA-related cancers include these types: ? Breast. This occurs in males or females. ? Ovarian. ? Tubal. This may also be called fallopian tube cancer. ? Cancer of the abdominal or pelvic lining (peritoneal cancer). ? Prostate. ? Pancreatic.  Cervical, Uterine, and Ovarian Cancer Your health care provider may recommend that you be screened regularly for cancer of the pelvic organs. These include your ovaries, uterus, and vagina. This screening involves a pelvic exam, which includes checking for microscopic changes to the surface of your cervix (Pap test).  For women ages 21-65, health care providers may recommend a pelvic exam and a Pap test every three years. For women ages 79-65, they may recommend the Pap test and pelvic exam, combined with testing for human papilloma virus (HPV), every five years. Some types of HPV increase your risk of cervical cancer. Testing for HPV may also be done on women of any age who have unclear Pap test results.  Other health care providers may not recommend any screening for nonpregnant women who are considered low risk for pelvic cancer and have no symptoms. Ask your health care provider if a screening pelvic exam is right for you.  If you have had past treatment for cervical cancer or a condition that could lead to cancer, you need Pap tests and screening for cancer for at least 20 years after your treatment. If Pap tests have been discontinued for you, your risk factors (such as having a new sexual partner) need to be  reassessed to determine if you should start having screenings again. Some women have medical problems that increase the chance of getting cervical cancer. In these cases, your health care provider may recommend that you have screening and Pap tests more often.  If you have a family history of uterine cancer or ovarian cancer, talk with your health care provider about genetic screening.  If you have vaginal bleeding after reaching menopause, tell your health care provider.  There are currently no reliable tests available to screen for ovarian cancer.  Lung Cancer Lung cancer screening is recommended for adults 69-62 years old who are at high risk for lung cancer because of a history of smoking. A yearly low-dose CT scan of the lungs is recommended if you:  Currently smoke.  Have a history of at least 30 pack-years of smoking and you currently smoke or have quit within the past 15 years. A pack-year is smoking an average of one pack of cigarettes per day for one year.  Yearly screening should:  Continue until it has been 15 years since you quit.  Stop if you develop a health problem that would prevent you from having lung cancer treatment.  Colorectal Cancer  This type of cancer can be detected and can often be prevented.  Routine colorectal cancer screening usually begins at  age 42 and continues through age 45.  If you have risk factors for colon cancer, your health care provider may recommend that you be screened at an earlier age.  If you have a family history of colorectal cancer, talk with your health care provider about genetic screening.  Your health care provider may also recommend using home test kits to check for hidden blood in your stool.  A small camera at the end of a tube can be used to examine your colon directly (sigmoidoscopy or colonoscopy). This is done to check for the earliest forms of colorectal cancer.  Direct examination of the colon should be repeated every  5-10 years until age 71. However, if early forms of precancerous polyps or small growths are found or if you have a family history or genetic risk for colorectal cancer, you may need to be screened more often.  Skin Cancer  Check your skin from head to toe regularly.  Monitor any moles. Be sure to tell your health care provider: ? About any new moles or changes in moles, especially if there is a change in a mole's shape or color. ? If you have a mole that is larger than the size of a pencil eraser.  If any of your family members has a history of skin cancer, especially at a young age, talk with your health care provider about genetic screening.  Always use sunscreen. Apply sunscreen liberally and repeatedly throughout the day.  Whenever you are outside, protect yourself by wearing long sleeves, pants, a wide-brimmed hat, and sunglasses.  What should I know about osteoporosis? Osteoporosis is a condition in which bone destruction happens more quickly than new bone creation. After menopause, you may be at an increased risk for osteoporosis. To help prevent osteoporosis or the bone fractures that can happen because of osteoporosis, the following is recommended:  If you are 46-71 years old, get at least 1,000 mg of calcium and at least 600 mg of vitamin D per day.  If you are older than age 55 but younger than age 65, get at least 1,200 mg of calcium and at least 600 mg of vitamin D per day.  If you are older than age 54, get at least 1,200 mg of calcium and at least 800 mg of vitamin D per day.  Smoking and excessive alcohol intake increase the risk of osteoporosis. Eat foods that are rich in calcium and vitamin D, and do weight-bearing exercises several times each week as directed by your health care provider. What should I know about how menopause affects my mental health? Depression may occur at any age, but it is more common as you become older. Common symptoms of depression  include:  Low or sad mood.  Changes in sleep patterns.  Changes in appetite or eating patterns.  Feeling an overall lack of motivation or enjoyment of activities that you previously enjoyed.  Frequent crying spells.  Talk with your health care provider if you think that you are experiencing depression. What should I know about immunizations? It is important that you get and maintain your immunizations. These include:  Tetanus, diphtheria, and pertussis (Tdap) booster vaccine.  Influenza every year before the flu season begins.  Pneumonia vaccine.  Shingles vaccine.  Your health care provider may also recommend other immunizations. This information is not intended to replace advice given to you by your health care provider. Make sure you discuss any questions you have with your health care provider. Document Released: 10/15/2005  Document Revised: 03/12/2016 Document Reviewed: 05/27/2015 Elsevier Interactive Patient Education  2018 [employer]  

## 2017-06-03 NOTE — Addendum Note (Signed)
Addended by: Lurlean Nanny on: 06/03/2017 04:12 PM   Modules accepted: Orders

## 2017-06-03 NOTE — Assessment & Plan Note (Signed)
Advised her to identify triggers and try to avoid them Continue Nexium for now

## 2017-06-03 NOTE — Progress Notes (Signed)
Subjective:    Patient ID: Katie Townsend, female    DOB: April 14, 1961, 56 y.o.   MRN: [id number]  HPI  Pt presents to the clinic today for her annual exam. She is also due to follow up chronic conditions.  Anxiety: Triggered by life stress. She takes Clonazepam prn, maybe once every 2 weeks. She has failed Prozac, Paxil, Pristiq, Zoloft and Celexa. She has seen psych in the past, but not recently.  GERD: She is not sure what triggers. She takes Nexium daily as prescribed with good relief.   Flu: 06/2016 Tetanus: 04/2013 Pap Smear: 2011, hysterectomy Mammogram: 03/2015 Colon Screening: 2012 Vision Screening: annually Dentist: annually  Diet: She does eat meat. She consumes fruits and veggies daily. She rarely eats fried foods. She drinks mostly water. Exercise: None  Review of Systems  Past Medical History:  Diagnosis Date  . Depression   . GERD (gastroesophageal reflux disease)     Current Outpatient Prescriptions  Medication Sig Dispense Refill  . clonazePAM (KLONOPIN) 0.5 MG tablet Take 2 tablets (1 mg total) by mouth daily. 30 tablet 0  . diphenhydrAMINE (BENADRYL) 25 MG tablet Take 25 mg by mouth at bedtime as needed.    Marland Kitchen esomeprazole (NEXIUM) 20 MG capsule Take 40 mg by mouth daily at 12 noon.      No current facility-administered medications for this visit.     Allergies  Allergen Reactions  . Codeine Nausea Only    Family History  Problem Relation Age of Onset  . Arthritis Mother   . Hyperlipidemia Mother   . Heart disease Mother   . Hypertension Mother   . Anxiety disorder Mother   . Diabetes Mother   . Alcohol abuse Father   . Arthritis Father   . Mental illness Father   . Diabetes Sister   . Diabetes Brother   . Diabetes Maternal Aunt     Social History   Social History  . Marital status: Married    Spouse name: N/A  . Number of children: N/A  . Years of education: N/A   Occupational History  . Not on file.   Social History Main Topics    . Smoking status: Never Smoker  . Smokeless tobacco: Never Used  . Alcohol use No  . Drug use: No  . Sexual activity: Yes    Birth control/ protection: Condom, Surgical   Other Topics Concern  . Not on file   Social History Narrative  . No narrative on file     Constitutional: Denies fever, malaise, fatigue, headache or abrupt weight changes.  HEENT: Denies eye pain, eye redness, ear pain, ringing in the ears, wax buildup, runny nose, nasal congestion, bloody nose, or sore throat. Respiratory: Denies difficulty breathing, shortness of breath, cough or sputum production.   Cardiovascular: Denies chest pain, chest tightness, palpitations or swelling in the hands or feet.  Gastrointestinal: Pt reports intermittent reflux. Denies abdominal pain, bloating, constipation, diarrhea or blood in the stool.  GU: Denies urgency, frequency, pain with urination, burning sensation, blood in urine, odor or discharge. Musculoskeletal: Denies decrease in range of motion, difficulty with gait, muscle pain or joint pain and swelling.  Skin: Denies redness, rashes, lesions or ulcercations.  Neurological: Denies dizziness, difficulty with memory, difficulty with speech or problems with balance and coordination.  Psych: Pt reports anxiety. Denies depression, SI/HI.  No other specific complaints in a complete review of systems (except as listed in HPI above).     Objective:  Physical Exam   BP 120/82   Pulse 72   Temp 98.2 F (36.8 C) (Oral)   Ht 5' 8.5" (1.74 m)   Wt 200 lb (90.7 kg)   SpO2 98%   BMI 29.97 kg/m  Wt Readings from Last 3 Encounters:  06/03/17 200 lb (90.7 kg)  06/09/16 196 lb 8 oz (89.1 kg)  12/31/15 196 lb (88.9 kg)    General: Appears her stated age, well developed, well nourished in NAD. Skin: Warm, dry and intact.  HEENT: Head: normal shape and size; Eyes: sclera white, no icterus, conjunctiva pink, PERRLA and EOMs intact; Right Ear: Tm's gray and intact, normal light  reflex, Left Ear: cerumen impaction; Throat/Mouth: Teeth present, mucosa pink and moist, no exudate, lesions or ulcerations noted.  Neck:  Neck supple, trachea midline. No masses, lumps or thyromegaly present.  Cardiovascular: Normal rate and rhythm. S1,S2 noted.  No murmur, rubs or gallops noted. No JVD or BLE edema. No carotid bruits noted. Pulmonary/Chest: Normal effort and positive vesicular breath sounds. No respiratory distress. No wheezes, rales or ronchi noted.  Abdomen: Soft and nontender. Normal bowel sounds. No distention or masses noted. Liver, spleen and kidneys non palpable. Musculoskeletal: Strength 5/5 BUE/BLE. No difficulty with gait.  Neurological: Alert and oriented. Cranial nerves II-XII grossly intact. Coordination normal.  Psychiatric: Mood and affect normal. Behavior is normal. Judgment and thought content normal.     BMET    Component Value Date/Time   NA 140 09/22/2015 1437   K 4.3 09/22/2015 1437   CL 103 09/22/2015 1437   CO2 28 09/22/2015 1437   GLUCOSE 102 (H) 09/22/2015 1437   BUN 11 09/22/2015 1437   CREATININE 0.76 09/22/2015 1437   CALCIUM 9.5 09/22/2015 1437   GFRNONAA >60 02/16/2015 0245   GFRAA >60 02/16/2015 0245    Lipid Panel  No results found for: CHOL, TRIG, HDL, CHOLHDL, VLDL, LDLCALC  CBC    Component Value Date/Time   WBC 7.7 09/22/2015 1437   RBC 4.11 09/22/2015 1437   HGB 12.9 09/22/2015 1437   HCT 38.7 09/22/2015 1437   PLT 250.0 09/22/2015 1437   MCV 94.2 09/22/2015 1437   MCH 31.9 02/16/2015 0245   MCHC 33.4 09/22/2015 1437   RDW 12.7 09/22/2015 1437    Hgb A1C No results found for: HGBA1C         Assessment & Plan:   Preventative Health Maintenance:  Flu shot today Tetanus UTD She does not need pap smears Mammogram ordered, she will call [facility] to schedule Colon Screening UTD Encouraged her to consume a balanced diet and exercise regimen Advised her to see an eye doctor and dentist annually Will check  CBC, CMET, Lipid, Vit D, HIV and Hep C  RTC in 1 year, sooner if needed Webb Silversmith, NP

## 2017-06-03 NOTE — Assessment & Plan Note (Addendum)
Stable on prn Clonazepam, refilled today CSA and UDS today

## 2017-06-04 LAB — HEPATITIS C ANTIBODY
HEP C AB: NONREACTIVE
SIGNAL TO CUT-OFF: 0.01 (ref <1.00)

## 2017-06-04 LAB — HIV ANTIBODY (ROUTINE TESTING W REFLEX): HIV: NONREACTIVE

## 2017-06-06 LAB — PAIN MGMT, PROFILE 5 DL W/CONF, U
AMPHETAMINES: NEGATIVE ng/mL (ref <500)
BARBITURATES: NEGATIVE ng/mL (ref <300)
BENZODIAZEPINES: NEGATIVE ng/mL (ref <100)
Cocaine Metabolite: NEGATIVE ng/mL (ref <150)
Creatinine: 160.5 mg/dL
METHADONE METABOLITE: NEGATIVE ng/mL (ref <100)
Marijuana Metabolite: NEGATIVE ng/mL (ref <20)
OXYCODONE: NEGATIVE ng/mL (ref <100)
Opiates: NEGATIVE ng/mL (ref <100)
Oxidant: NEGATIVE ug/mL (ref <200)
pH: 6.38 (ref 4.5–9.0)

## 2017-06-07 ENCOUNTER — Other Ambulatory Visit: Payer: Self-pay | Admitting: Internal Medicine

## 2017-06-07 ENCOUNTER — Encounter: Payer: Self-pay | Admitting: Internal Medicine

## 2017-06-07 MED ORDER — VITAMIN D (ERGOCALCIFEROL) 1.25 MG (50000 UNIT) PO CAPS: 50000.0000 [IU] | ORAL_CAPSULE | ORAL | 0 refills | Status: DC

## 2017-08-01 ENCOUNTER — Encounter: Payer: Self-pay | Admitting: Internal Medicine

## 2017-08-01 ENCOUNTER — Ambulatory Visit (INDEPENDENT_AMBULATORY_CARE_PROVIDER_SITE_OTHER): Payer: 59 | Admitting: Internal Medicine

## 2017-08-01 VITALS — BP 136/82 | HR 79 | Temp 98.0°F | Wt 206.5 lb

## 2017-08-01 DIAGNOSIS — S29012A Strain of muscle and tendon of back wall of thorax, initial encounter: Secondary | ICD-10-CM

## 2017-08-01 MED ORDER — KETOROLAC TROMETHAMINE 30 MG/ML IJ SOLN
30.0000 mg | Freq: Once | INTRAMUSCULAR | Status: AC
  Administered 2017-08-01: 30 mg via INTRAMUSCULAR

## 2017-08-01 MED ORDER — METHOCARBAMOL 500 MG PO TABS: 500.0000 mg | ORAL_TABLET | Freq: Three times a day (TID) | ORAL | 0 refills | Status: DC | PRN

## 2017-08-01 NOTE — Progress Notes (Signed)
Subjective:    Patient ID: Katie Townsend, female    DOB: 25-May-1961, 56 y.o.   MRN: [id number]  HPI  Pt presents to the clinic today with c/o right upper back pain. She reports started 2-3 days ago. She describes the pain as a dull achy. It can be sharp and stabbing with certain movements. Breathing makes the pain worse but she denies cough, chest tightness or shortness of breath. She has tried Ibuprofen, heat and ice with minimal relief. She denies any injury to the area. She has not noticed any rash overlying the area.   Review of Systems  Past Medical History:  Diagnosis Date  . Depression   . GERD (gastroesophageal reflux disease)     Current Outpatient Medications  Medication Sig Dispense Refill  . clonazePAM (KLONOPIN) 0.5 MG tablet Take 1 tablet (0.5 mg total) by mouth daily as needed for anxiety. 30 tablet 0  . diphenhydrAMINE (BENADRYL) 25 MG tablet Take 25 mg by mouth at bedtime as needed.    Marland Kitchen esomeprazole (NEXIUM) 20 MG capsule Take 40 mg by mouth daily at 12 noon.     . Vitamin D, Ergocalciferol, (DRISDOL) 50000 units CAPS capsule Take 1 capsule (50,000 Units total) by mouth every 7 (seven) days. 12 capsule 0   No current facility-administered medications for this visit.     Allergies  Allergen Reactions  . Codeine Nausea Only    Family History  Problem Relation Age of Onset  . Arthritis Mother   . Hyperlipidemia Mother   . Heart disease Mother   . Hypertension Mother   . Anxiety disorder Mother   . Diabetes Mother   . Alcohol abuse Father   . Arthritis Father   . Mental illness Father   . Diabetes Sister   . Diabetes Brother   . Diabetes Maternal Aunt     Social History   Socioeconomic History  . Marital status: Married    Spouse name: Not on file  . Number of children: Not on file  . Years of education: Not on file  . Highest education level: Not on file  Social Needs  . Financial resource strain: Not on file  . Food insecurity - worry: Not on  file  . Food insecurity - inability: Not on file  . Transportation needs - medical: Not on file  . Transportation needs - non-medical: Not on file  Occupational History  . Not on file  Tobacco Use  . Smoking status: Never Smoker  . Smokeless tobacco: Never Used  Substance and Sexual Activity  . Alcohol use: No    Alcohol/week: 0.0 oz  . Drug use: No  . Sexual activity: Yes    Birth control/protection: Condom, Surgical  Other Topics Concern  . Not on file  Social History Narrative  . Not on file     Constitutional: Denies fever, malaise, fatigue, headache or abrupt weight changes. Respiratory: Denies difficulty breathing, shortness of breath, cough or sputum production.   Cardiovascular: Denies chest pain, chest tightness, palpitations or swelling in the hands or feet.  Gastrointestinal: Denies abdominal pain, bloating, constipation, diarrhea or blood in the stool.  GU: Denies urgency, frequency, pain with urination, burning sensation, blood in urine, odor or discharge. Musculoskeletal: Pt reports upper back pain. Denies decrease in range of motion, difficulty with gait, muscle pain or joint pain and swelling.  Skin: Denies redness, rashes, lesions or ulcercations.   No other specific complaints in a complete review of systems (except as listed  in HPI above).     Objective:   Physical Exam    BP 136/82   Pulse 79   Temp 98 F (36.7 C) (Oral)   Wt 206 lb 8 oz (93.7 kg)   SpO2 98%   BMI 30.94 kg/m  Wt Readings from Last 3 Encounters:  08/01/17 206 lb 8 oz (93.7 kg)  06/03/17 200 lb (90.7 kg)  06/09/16 196 lb 8 oz (89.1 kg)    General: Appears her stated age, in NAD. Skin: Warm, dry and intact. No rashes noted. Cardiovascular: Normal rate and rhythm. S1,S2 noted.  No murmur, rubs or gallops noted.  Pulmonary/Chest: Normal effort and positive vesicular breath sounds. No respiratory distress. No wheezes, rales or ronchi noted.  Musculoskeletal: Normal flexion,  extension, rotation and lateral bending of the spine. No bony tenderness noted over the spine. Pain with palpation subscapularly on the right. Strength 5/5 BUE.  BMET    Component Value Date/Time   NA 137 06/03/2017 1509   K 3.8 06/03/2017 1509   CL 100 06/03/2017 1509   CO2 31 06/03/2017 1509   GLUCOSE 102 (H) 06/03/2017 1509   BUN 10 06/03/2017 1509   CREATININE 0.69 06/03/2017 1509   CALCIUM 9.6 06/03/2017 1509   GFRNONAA >60 02/16/2015 0245   GFRAA >60 02/16/2015 0245    Lipid Panel     Component Value Date/Time   CHOL 218 (H) 06/03/2017 1509   TRIG 182.0 (H) 06/03/2017 1509   HDL 43.30 06/03/2017 1509   CHOLHDL 5 06/03/2017 1509   VLDL 36.4 06/03/2017 1509   LDLCALC 139 (H) 06/03/2017 1509    CBC    Component Value Date/Time   WBC 8.6 06/03/2017 1509   RBC 4.00 06/03/2017 1509   HGB 12.8 06/03/2017 1509   HCT 37.9 06/03/2017 1509   PLT 265.0 06/03/2017 1509   MCV 94.7 06/03/2017 1509   MCH 31.9 02/16/2015 0245   MCHC 33.9 06/03/2017 1509   RDW 13.2 06/03/2017 1509    Hgb A1C No results found for: HGBA1C        Assessment & Plan:   Muscle Strain of Right Upper Back:  30 nmg Toradol IM today Continue heat Ibuprofen 600-800 mg every 8 hours prn eRx for Robaxin 500 mg TID prn- sedation caution given Massage may be helpful  Return precautions discussed BAITY, REGINA, NP  Stretching exercised given

## 2017-08-01 NOTE — Patient Instructions (Signed)

## 2017-08-01 NOTE — Addendum Note (Signed)
Addended by: Modena Nunnery on: 08/01/2017 11:09 AM   Modules accepted: Orders

## 2017-09-02 ENCOUNTER — Ambulatory Visit: Payer: Self-pay | Admitting: *Deleted

## 2017-09-02 NOTE — Telephone Encounter (Signed)
Pt called regarding symptoms of a cold that she feels like she is getting over but still has some congestion. Last elevate temp was yesterday, none today.  Home care advice given to her with verbal understanding.  Reason for Disposition . Cold with no complications  Answer Assessment - Initial Assessment Questions 1. ONSET: "When did the nasal discharge start?"      Started Monday or tuesday 2. AMOUNT: "How much discharge is there?"      Nothing today 3. COUGH: "Do you have a cough?" If yes, ask: "Describe the color of your sputum" (clear, white, yellow, green)     Congested cough not producing much 4. RESPIRATORY DISTRESS: "Describe your breathing."      Wheezing, chest heaviness 5. FEVER: "Do you have a fever?" If so, ask: "What is your temperature, how was it measured, and when did it start?"     101 on 'Wednesday and Thursday 6. SEVERITY: "Overall, how bad are you feeling right now?" (e.g., doesn't interfere with normal activities, staying home from school/work, staying in bed)      Feeling tired now 7. OTHER SYMPTOMS: "Do you have any other symptoms?" (e.g., sore throat, earache, wheezing, vomiting)     wheezing 8. PREGNANCY: "Is there any chance you are pregnant?" "When was your last menstrual period?"     no  Protocols used: COMMON COLD-A-AH

## 2017-09-03 ENCOUNTER — Ambulatory Visit (HOSPITAL_COMMUNITY): Payer: 59

## 2017-09-03 ENCOUNTER — Encounter (HOSPITAL_COMMUNITY): Payer: Self-pay | Admitting: Emergency Medicine

## 2017-09-03 ENCOUNTER — Other Ambulatory Visit: Payer: Self-pay

## 2017-09-03 ENCOUNTER — Ambulatory Visit (HOSPITAL_COMMUNITY)
Admission: EM | Admit: 2017-09-03 | Discharge: 2017-09-03 | Disposition: A | Discharge status: Home / Self Care | Payer: 59 | Attending: Orthopedic Surgery | Admitting: Orthopedic Surgery

## 2017-09-03 DIAGNOSIS — J069 Acute upper respiratory infection, unspecified: Secondary | ICD-10-CM

## 2017-09-03 DIAGNOSIS — R05 Cough: Secondary | ICD-10-CM

## 2017-09-03 DIAGNOSIS — J45998 Other asthma: Secondary | ICD-10-CM

## 2017-09-03 DIAGNOSIS — R062 Wheezing: Secondary | ICD-10-CM

## 2017-09-03 MED ORDER — IPRATROPIUM-ALBUTEROL 0.5-2.5 (3) MG/3ML IN SOLN
3.0000 mL | Freq: Once | RESPIRATORY_TRACT | Status: AC
  Administered 2017-09-03: 3 mL via RESPIRATORY_TRACT

## 2017-09-03 MED ORDER — ALBUTEROL SULFATE HFA 108 (90 BASE) MCG/ACT IN AERS: 1.0000 | INHALATION_SPRAY | Freq: Four times a day (QID) | RESPIRATORY_TRACT | 0 refills | Status: DC | PRN

## 2017-09-03 MED ORDER — SODIUM CHLORIDE 0.9 % IN NEBU
INHALATION_SOLUTION | RESPIRATORY_TRACT | Status: AC
  Filled 2017-09-03: qty 3

## 2017-09-03 MED ORDER — PREDNISONE 20 MG PO TABS: 40.0000 mg | ORAL_TABLET | Freq: Every day | ORAL | 0 refills | Status: DC

## 2017-09-03 MED ORDER — IPRATROPIUM-ALBUTEROL 0.5-2.5 (3) MG/3ML IN SOLN
RESPIRATORY_TRACT | Status: AC
  Filled 2017-09-03: qty 3

## 2017-09-03 NOTE — ED Provider Notes (Signed)
[name]    CSN: [id number] Arrival date & time: 09/03/17  1426     History   Chief Complaint Chief Complaint  Patient presents with  . Cough    HPI Katie Townsend is a 56 y.o. female presents to the urgent care facility for evaluation of cough, congestion, chest tightness and intermittent wheezing.  Symptoms have been present for 5 days.  She has had intermittent cough is dry, nonproductive with intermittent fevers.  Fevers have improved in the last couple days but cough is been persistent.  She feels that she is unable to take a deep breath.  She denies any chest pain but does have inability to take a deep breath.  She is taking over-the-counter medications with some mild improvement.  HPI  Past Medical History:  Diagnosis Date  . Depression   . GERD (gastroesophageal reflux disease)     Patient Active Problem List   Diagnosis Date Noted  . GERD (gastroesophageal reflux disease) 08/21/2014  . Anxiety 08/21/2014    Past Surgical History:  Procedure Laterality Date  . ABDOMINAL HYSTERECTOMY  2009  . APPENDECTOMY  1978  . EYE SURGERY Right 1976    OB History    No data available       Home Medications    Prior to Admission medications   Medication Sig Start Date End Date Taking? Authorizing Provider  clonazePAM (KLONOPIN) 0.5 MG tablet Take 1 tablet (0.5 mg total) by mouth daily as needed for anxiety. 06/03/17  Yes Jearld Fenton, NP  diphenhydrAMINE (BENADRYL) 25 MG tablet Take 25 mg by mouth at bedtime as needed.   Yes [provider]  esomeprazole (NEXIUM) 20 MG capsule Take 40 mg by mouth daily at 12 noon.    Yes [provider]  Vitamin D, Ergocalciferol, (DRISDOL) 50000 units CAPS capsule Take 1 capsule (50,000 Units total) by mouth every 7 (seven) days. 06/07/17  Yes Baity, Coralie Keens, NP  albuterol (PROVENTIL HFA;VENTOLIN HFA) 108 (90 Base) MCG/ACT inhaler Inhale 1-2 puffs into the lungs every 6 (six) hours as needed for  wheezing or shortness of breath. 09/03/17   Duanne Guess, PA-C  methocarbamol (ROBAXIN) 500 MG tablet Take 1 tablet (500 mg total) by mouth every 8 (eight) hours as needed for muscle spasms. 08/01/17   Jearld Fenton, NP  predniSONE (DELTASONE) 20 MG tablet Take 2 tablets (40 mg total) by mouth daily. 09/03/17   Duanne Guess, PA-C    Family History Family History  Problem Relation Age of Onset  . Arthritis Mother   . Hyperlipidemia Mother   . Heart disease Mother   . Hypertension Mother   . Anxiety disorder Mother   . Diabetes Mother   . Alcohol abuse Father   . Arthritis Father   . Mental illness Father   . Diabetes Sister   . Diabetes Brother   . Diabetes Maternal Aunt     Social History Social History   Tobacco Use  . Smoking status: Never Smoker  . Smokeless tobacco: Never Used  Substance Use Topics  . Alcohol use: No    Alcohol/week: 0.0 oz  . Drug use: No     Allergies   Codeine   Review of Systems Review of Systems  Constitutional: Negative for fever.  HENT: Positive for congestion, rhinorrhea and sore throat. Negative for ear discharge, sinus pressure, sinus pain, trouble swallowing and voice change.   Respiratory: Positive for cough and chest tightness. Negative for shortness of  breath, wheezing and stridor.   Cardiovascular: Negative for chest pain.  Gastrointestinal: Negative for abdominal pain, diarrhea, nausea and vomiting.  Genitourinary: Negative for dysuria, flank pain and pelvic pain.  Musculoskeletal: Positive for myalgias. Negative for back pain.  Skin: Negative for rash.  Neurological: Negative for dizziness and headaches.     Physical Exam Triage Vital Signs ED Triage Vitals [09/03/17 1612]  Enc Vitals Group     BP 133/83     Pulse Rate 79     Resp 16     Temp 98.3 F (36.8 C)     Temp src      SpO2 100 %     Weight      Height      Head Circumference      Peak Flow      Pain Score      Pain Loc      Pain Edu?       Excl. in [place]?    No data found.  Updated Vital Signs BP 133/83   Pulse 79   Temp 98.3 F (36.8 C)   Resp 16   SpO2 100%   Visual Acuity Right Eye Distance:   Left Eye Distance:   Bilateral Distance:    Right Eye Near:   Left Eye Near:    Bilateral Near:     Physical Exam  Constitutional: She is oriented to person, place, and time. She appears well-developed and well-nourished. No distress.  HENT:  Head: Normocephalic and atraumatic.  Right Ear: Hearing, tympanic membrane, external ear and ear canal normal.  Left Ear: Hearing, tympanic membrane, external ear and ear canal normal.  Nose: Rhinorrhea present.  Mouth/Throat: Mucous membranes are normal. No trismus in the jaw. No uvula swelling. Posterior oropharyngeal erythema present. No oropharyngeal exudate, posterior oropharyngeal edema or tonsillar abscesses. No tonsillar exudate.  Eyes: Conjunctivae are normal. Right eye exhibits no discharge. Left eye exhibits no discharge.  Neck: Normal range of motion.  Cardiovascular: Normal rate and regular rhythm.  Pulmonary/Chest: Effort normal. No stridor. No respiratory distress. She has wheezes (mild). She has no rales.  Abdominal: Soft. She exhibits no distension. There is no tenderness.  Musculoskeletal: Normal range of motion. She exhibits no deformity.  Lymphadenopathy:    She has cervical adenopathy.  Neurological: She is alert and oriented to person, place, and time. She has normal reflexes.  Skin: Skin is warm and dry.  Psychiatric: She has a normal mood and affect. Her behavior is normal. Thought content normal.     UC Treatments / Results  Labs (all labs ordered are listed, but only abnormal results are displayed) Labs Reviewed - No data to display  EKG  EKG Interpretation None       Radiology Dg Chest 2 View  Result Date: 09/03/2017 CLINICAL DATA:  Cough for several days with fevers EXAM: CHEST  2 VIEW COMPARISON:  02/16/2015 FINDINGS: The heart size and  mediastinal contours are within normal limits. Both lungs are clear. The visualized skeletal structures are unremarkable. IMPRESSION: No active cardiopulmonary disease. Electronically Signed   By: Inez Catalina M.D.   On: 09/03/2017 17:12    Procedures Procedures (including critical care time)  Medications Ordered in UC Medications  ipratropium-albuterol (DUONEB) 0.5-2.5 (3) MG/3ML nebulizer solution 3 mL (3 mLs Nebulization Given 09/03/17 1706)     Initial Impression / Assessment and Plan / UC Course  I have reviewed the triage vital signs and the nursing notes.  Pertinent labs & imaging  results that were available during my care of the patient were reviewed by me and considered in my medical decision making (see chart for details).     56 year old female with upper respiratory infection for 5 days.  She has had increase in chest tightness with intermittent wheezing.  Symptoms completely resolved with DuoNeb treatment.  Chest x-ray negative for any acute cardiopulmonary process.  Patient is given a prescription for albuterol, prednisone and will continue with over-the-counter medications.  She is educated in signs and symptoms return to the clinic for.  Final Clinical Impressions(s) / UC Diagnoses   Final diagnoses:  Viral upper respiratory tract infection  Post viral asthma    ED Discharge Orders        Ordered    albuterol (PROVENTIL HFA;VENTOLIN HFA) 108 (90 Base) MCG/ACT inhaler  Every 6 hours PRN     09/03/17 1727    predniSONE (DELTASONE) 20 MG tablet  Daily     09/03/17 1727         Duanne Guess, [place] 09/03/17 1730

## 2017-09-03 NOTE — ED Notes (Signed)
Patient transported to X-ray 

## 2017-09-03 NOTE — Discharge Instructions (Signed)
Please continue with over-the-counter cough and cold medications.  Take medications as prescribed.  Make sure you are drinking lots of fluids.  Return to the clinic for any fevers, worsening cough, shortness of breath or any urgent changes

## 2017-09-03 NOTE — ED Triage Notes (Signed)
Pt c/o feeling cough, chest congestion, fever, fatigue since Sunday.

## 2017-09-12 ENCOUNTER — Other Ambulatory Visit: Payer: Self-pay | Admitting: Internal Medicine

## 2017-09-12 MED ORDER — CLONAZEPAM 0.5 MG PO TABS: 0.5000 mg | ORAL_TABLET | Freq: Every day | ORAL | 0 refills | Status: DC | PRN

## 2017-09-12 NOTE — Telephone Encounter (Signed)
Last filled 06/03/17... Please advise

## 2017-09-12 NOTE — Telephone Encounter (Signed)
Will send electronically.

## 2017-10-20 ENCOUNTER — Encounter: Payer: Self-pay | Admitting: Internal Medicine

## 2017-10-20 ENCOUNTER — Ambulatory Visit (INDEPENDENT_AMBULATORY_CARE_PROVIDER_SITE_OTHER): Payer: 59 | Admitting: Internal Medicine

## 2017-10-20 DIAGNOSIS — B9789 Other viral agents as the cause of diseases classified elsewhere: Secondary | ICD-10-CM

## 2017-10-20 DIAGNOSIS — J069 Acute upper respiratory infection, unspecified: Secondary | ICD-10-CM

## 2017-10-20 MED ORDER — PREDNISONE 20 MG PO TABS: 40.0000 mg | ORAL_TABLET | Freq: Every day | ORAL | 0 refills | Status: DC

## 2017-10-20 MED ORDER — PROMETHAZINE-DM 6.25-15 MG/5ML PO SYRP: 5.0000 mL | ORAL_SOLUTION | Freq: Two times a day (BID) | ORAL | 0 refills | Status: DC | PRN

## 2017-10-20 NOTE — Progress Notes (Signed)
   Subjective:    Patient ID: Katie Townsend, female    DOB: 1960-11-09, 57 y.o.   MRN: [id number]  HPI The patient is a 57 YO female coming in for 3-4 days of sore throat. Having some fevers as well during that time. She is having cough as well but not productive. She denies chills. Denies muscle aches but is having a lot of sinus pressure. She is overall stable to mildly improved since onset. She has tried tylenol and mucinex which she feels like has helped some.  Review of Systems  Constitutional: Positive for activity change, appetite change and chills. Negative for fatigue, fever and unexpected weight change.  HENT: Positive for congestion, postnasal drip, rhinorrhea and sinus pressure. Negative for ear discharge, ear pain, sinus pain, sneezing, sore throat, tinnitus, trouble swallowing and voice change.   Eyes: Negative.   Respiratory: Positive for cough. Negative for chest tightness, shortness of breath and wheezing.   Cardiovascular: Negative.   Gastrointestinal: Negative.   Musculoskeletal: Positive for myalgias.  Neurological: Negative.       Objective:   Physical Exam  Constitutional: She is oriented to person, place, and time. She appears well-developed and well-nourished.  HENT:  Head: Normocephalic and atraumatic.  Oropharynx with redness and clear drainage, nose with swollen turbinates, TMs normal bilaterally  Eyes: EOM are normal.  Neck: Normal range of motion. No thyromegaly present.  Cardiovascular: Normal rate and regular rhythm.  Pulmonary/Chest: Effort normal and breath sounds normal. No respiratory distress. She has no wheezes. She has no rales.  Abdominal: Soft.  Musculoskeletal: She exhibits no tenderness.  Lymphadenopathy:    She has no cervical adenopathy.  Neurological: She is alert and oriented to person, place, and time.  Skin: Skin is warm and dry.   Vitals:   10/20/17 1601  BP: 124/78  Pulse: 84  Temp: 98.6 F (37 C)  TempSrc: Oral  SpO2: 99%    Weight: 200 lb (90.7 kg)  Height: 5' 8.5" (1.74 m)      Assessment & Plan:

## 2017-10-20 NOTE — Patient Instructions (Signed)
We have sent in prednisone for the sinuses. Take 2 pills daily for 5 days.   We have sent in the cough medicine to use at night time as well.

## 2017-10-21 DIAGNOSIS — J069 Acute upper respiratory infection, unspecified: Secondary | ICD-10-CM | POA: Insufficient documentation

## 2017-10-21 DIAGNOSIS — B9789 Other viral agents as the cause of diseases classified elsewhere: Principal | ICD-10-CM

## 2017-10-21 NOTE — Assessment & Plan Note (Signed)
Rx for prednisone and promethazine cough syrup. No indication for antibiotics and encouraged to take zyrtec for drainage as well.

## 2017-11-18 DIAGNOSIS — Z1231 Encounter for screening mammogram for malignant neoplasm of breast: Secondary | ICD-10-CM | POA: Diagnosis not present

## 2017-11-18 LAB — HM MAMMOGRAPHY

## 2017-11-23 ENCOUNTER — Encounter: Payer: Self-pay | Admitting: Internal Medicine

## 2017-11-24 ENCOUNTER — Encounter: Payer: Self-pay | Admitting: Internal Medicine

## 2017-12-06 ENCOUNTER — Other Ambulatory Visit: Payer: Self-pay | Admitting: Internal Medicine

## 2017-12-07 MED ORDER — CLONAZEPAM 0.5 MG PO TABS: 0.5000 mg | ORAL_TABLET | Freq: Every day | ORAL | 0 refills | Status: DC | PRN

## 2017-12-07 NOTE — Telephone Encounter (Signed)
Last filled 09/12/17... Please advise

## 2018-02-17 ENCOUNTER — Encounter: Payer: Self-pay | Admitting: Family Medicine

## 2018-02-17 ENCOUNTER — Ambulatory Visit (INDEPENDENT_AMBULATORY_CARE_PROVIDER_SITE_OTHER): Payer: 59 | Admitting: Family Medicine

## 2018-02-17 DIAGNOSIS — F419 Anxiety disorder, unspecified: Secondary | ICD-10-CM | POA: Diagnosis not present

## 2018-02-17 DIAGNOSIS — G4700 Insomnia, unspecified: Secondary | ICD-10-CM | POA: Diagnosis not present

## 2018-02-17 MED ORDER — TRAZODONE HCL 50 MG PO TABS: 25.0000 mg | ORAL_TABLET | Freq: Every evening | ORAL | 0 refills | Status: DC | PRN

## 2018-02-17 MED ORDER — CLONAZEPAM 0.5 MG PO TABS: 0.5000 mg | ORAL_TABLET | Freq: Every day | ORAL | 0 refills | Status: DC | PRN

## 2018-02-17 NOTE — Progress Notes (Signed)
She had used klonopin prn for anxiety.  Not used daily.  No ADE on med.    Insomnia.  Longstanding.  Trouble getting to sleep, has been waking in the middle of the night, then trouble getting back to sleep.  Hard to work due to fatigue.  Going on for about 6 months.  Taking benadryl at night but that stopped helping.    Work is okay in [employer], she likes her coworkers.  Safe at home, home situation is good.  She doesn't have h/o insomnia in earlier decades.  Mood is still good now.  H/o anxiety and depression in the past but that "wasn't an issue now."    Caffeine- 1-2 cups of coffee a day, but only in the AM.  Not eating late at night.  etoh- none.  illicits- none.    She didn't know if she could attribute her sx to menopause.  She isn't have night sweats or hot flashes now.    She didn't tolerate melatonin.   Meds, vitals, and allergies reviewed.   ROS: Per HPI unless specifically indicated in ROS section   GEN: nad, alert and oriented HEENT: mucous membranes moist NECK: supple w/o LA CV: rrr.  PULM: ctab, no inc wob ABD: soft, +bs EXT: no edema SKIN: Well-perfused Speech and affect normal.  No tremor.

## 2018-02-17 NOTE — Patient Instructions (Addendum)
Reasonable to try trazodone.  Don't take it with klonopin.   Update [place] as needed.  Take care.  Glad to see you.

## 2018-02-18 DIAGNOSIS — G4700 Insomnia, unspecified: Secondary | ICD-10-CM | POA: Insufficient documentation

## 2018-02-18 DIAGNOSIS — G479 Sleep disorder, unspecified: Secondary | ICD-10-CM | POA: Insufficient documentation

## 2018-02-18 DIAGNOSIS — F5105 Insomnia due to other mental disorder: Secondary | ICD-10-CM | POA: Insufficient documentation

## 2018-02-18 NOTE — Assessment & Plan Note (Signed)
Benadryl stopped helping patient.  Reasonable to try trazodone.  Discussed with patient about diet, exercise, routine cautions on trazodone.  Update [place] as needed.  Advised not to use trazodone with Klonopin.  Okay for outpatient follow-up.  Routed to PCP as FYI.

## 2018-02-18 NOTE — Assessment & Plan Note (Signed)
I refilled her benzodiazepine in the meantime.  Routine cautions given.  Advised not to use with trazodone.  Update [place] as needed.  Routed to PCP as FYI.

## 2018-03-24 ENCOUNTER — Ambulatory Visit (INDEPENDENT_AMBULATORY_CARE_PROVIDER_SITE_OTHER): Payer: 59 | Admitting: Internal Medicine

## 2018-03-24 ENCOUNTER — Encounter: Payer: Self-pay | Admitting: Internal Medicine

## 2018-03-24 VITALS — BP 132/78 | HR 74 | Temp 98.1°F | Ht 68.5 in | Wt 201.0 lb

## 2018-03-24 DIAGNOSIS — M7061 Trochanteric bursitis, right hip: Secondary | ICD-10-CM

## 2018-03-24 MED ORDER — MELOXICAM 15 MG PO TABS: 15.0000 mg | ORAL_TABLET | Freq: Every day | ORAL | 0 refills | Status: DC

## 2018-03-24 MED ORDER — METHYLPREDNISOLONE ACETATE 40 MG/ML IJ SUSP
40.0000 mg | Freq: Once | INTRAMUSCULAR | Status: AC
  Administered 2018-03-24: 40 mg via INTRAMUSCULAR

## 2018-03-24 MED ORDER — CYCLOBENZAPRINE HCL 5 MG PO TABS: 5.0000 mg | ORAL_TABLET | Freq: Three times a day (TID) | ORAL | 1 refills | Status: DC | PRN

## 2018-03-24 NOTE — Assessment & Plan Note (Signed)
Depo-medrol 40 mg IM given at visit. Rx for meloxicam and flexeril at visit. Call back if not improved in 1-2 weeks. Given stretching exercises to aid in recovery. Advised heat to area for pain relief. Likely started by change in gait from ankle sprain.

## 2018-03-24 NOTE — Patient Instructions (Signed)
We have sent in meloxicam to take 1 pill daily for the next 1-2 weeks.   We have sent in flexeril which is the muscle relaxer to take if needed. This can make you sleepy or drowsy so be careful when taking. Do not drive after taking.  We have given you a shot today which should help the pain.    Trochanteric Bursitis Rehab Ask your health care provider which exercises are safe for you. Do exercises exactly as told by your health care provider and adjust them as directed. It is normal to feel mild stretching, pulling, tightness, or discomfort as you do these exercises, but you should stop right away if you feel sudden pain or your pain gets worse.Do not begin these exercises until told by your health care provider. Stretching exercises These exercises warm up your muscles and joints and improve the movement and flexibility of your hip. These exercises also help to relieve pain and stiffness. Exercise A: Iliotibial band stretch  1. Lie on your side with your left / right leg in the top position. 2. Bend your left / right knee and grab your ankle. 3. Slowly bring your knee back so your thigh is behind your body. 4. Slowly lower your knee toward the floor until you feel a gentle stretch on the outside of your left / right thigh. If you do not feel a stretch and your knee will not fall farther, place the heel of your other foot on top of your outer knee and pull your thigh down farther. 5. Hold this position for __________ seconds. 6. Slowly return to the starting position. Repeat __________ times. Complete this exercise __________ times a day. Strengthening exercises These exercises build strength and endurance in your hip and pelvis. Endurance is the ability to use your muscles for a long time, even after they get tired. Exercise B: Bridge ( hip extensors) 1. Lie on your back on a firm surface with your knees bent and your feet flat on the floor. 2. Tighten your buttocks muscles and lift your  buttocks off the floor until your trunk is level with your thighs. You should feel the muscles working in your buttocks and the back of your thighs. If this exercise is too easy, try doing it with your arms crossed over your chest. 3. Hold this position for __________ seconds. 4. Slowly return to the starting position. 5. Let your muscles relax completely between repetitions. Repeat __________ times. Complete this exercise __________ times a day. Exercise C: Squats ( knee extensors and  quadriceps) 1. Stand in front of a table, with your feet and knees pointing straight ahead. You may rest your hands on the table for balance but not for support. 2. Slowly bend your knees and lower your hips like you are going to sit in a chair. ? Keep your weight over your heels, not over your toes. ? Keep your lower legs upright so they are parallel with the table legs. ? Do not let your hips go lower than your knees. ? Do not bend lower than told by your health care provider. ? If your hip pain increases, do not bend as low. 3. Hold this position for __________ seconds. 4. Slowly push with your legs to return to standing. Do not use your hands to pull yourself to standing. Repeat __________ times. Complete this exercise __________ times a day. Exercise D: Hip hike 1. Stand sideways on a bottom step. Stand on your left / right leg with  your other foot unsupported next to the step. You can hold onto the railing or wall if needed for balance. 2. Keeping your knees straight and your torso square, lift your left / right hip up toward the ceiling. 3. Hold this position for __________ seconds. 4. Slowly let your left / right hip lower toward the floor, past the starting position. Your foot should get closer to the floor. Do not lean or bend your knees. Repeat __________ times. Complete this exercise __________ times a day. Exercise E: Single leg stand 1. Stand near a counter or door frame that you can hold onto  for balance as needed. It is helpful to stand in front of a mirror for this exercise so you can watch your hip. 2. Squeeze your left / right buttock muscles then lift up your other foot. Do not let your left / right hip push out to the side. 3. Hold this position for __________ seconds. Repeat __________ times. Complete this exercise __________ times a day. This information is not intended to replace advice given to you by your health care provider. Make sure you discuss any questions you have with your health care provider. Document Released: 09/30/2004 Document Revised: 04/29/2016 Document Reviewed: 08/08/2015 Elsevier Interactive Patient Education  [employer].

## 2018-03-24 NOTE — Progress Notes (Signed)
   Subjective:    Patient ID: Katie Townsend, female    DOB: [birth year], 57 y.o.   MRN: [id number]  HPI The patient is a 57 YO female coming in for pain in low back and right hip. Low back pain started about 2-3 weeks ago. She fell about 9 weeks ago and twisted her right ankle. Did not get back pain although she did fall and hit her sacrum. She has been using tylenol and ibuprofen for pain at home which is not helping much. She has tried robaxin which she had leftover from some time ago which did not help much. She denies direct injury or overuse. Overall is stable to mildly worse since onset. Pain goes into the hip and around down to her knee. Denies falls or numbness. Denies bowel or bladder change. Denies weight change.   Review of Systems  Constitutional: Positive for activity change. Negative for appetite change, chills, fatigue, fever and unexpected weight change.  Respiratory: Negative.   Cardiovascular: Negative.   Gastrointestinal: Negative.   Musculoskeletal: Positive for arthralgias, back pain, gait problem and myalgias. Negative for joint swelling.  Skin: Negative.       Objective:   Physical Exam  Constitutional: She is oriented to person, place, and time. She appears well-developed and well-nourished.  HENT:  Head: Normocephalic and atraumatic.  Eyes: EOM are normal.  Neck: Normal range of motion.  Cardiovascular: Normal rate and regular rhythm.  Pulmonary/Chest: Effort normal and breath sounds normal. No respiratory distress. She has no wheezes. She has no rales.  Abdominal: Soft. She exhibits no distension. There is no tenderness. There is no rebound.  Musculoskeletal: She exhibits tenderness. She exhibits no edema.  Tenderness in the SI region bilaterally and along the right trochanteric bursa. No groin pain.   Neurological: She is alert and oriented to person, place, and time. Coordination normal.  Skin: Skin is warm and dry.   Vitals:   03/24/18 1054  BP: 132/78    Pulse: 74  Temp: 98.1 F (36.7 C)  TempSrc: Oral  SpO2: 99%  Weight: 201 lb (91.2 kg)  Height: 5' 8.5" (1.74 m)      Assessment & Plan:

## 2018-05-18 ENCOUNTER — Other Ambulatory Visit: Payer: Self-pay | Admitting: Internal Medicine

## 2018-05-18 MED ORDER — CLONAZEPAM 0.5 MG PO TABS: 0.5000 mg | ORAL_TABLET | Freq: Every day | ORAL | 0 refills | Status: DC | PRN

## 2018-05-18 NOTE — Telephone Encounter (Signed)
Copied from [place] [id number]. Topic: Quick Communication - Rx Refill/Question >> May 18, 2018  3:28 PM Yvette Rack wrote: Medication: clonazePAM (KLONOPIN) 0.5 MG tablet  Has the patient contacted their pharmacy? no  Preferred Pharmacy (with phone number or street name): [facility] [address], [place] - [place] [phone] (Phone) [phone] (Fax)  Agent: Please be advised that RX refills may take up to 3 business days. We ask that you follow-up with your pharmacy.

## 2018-05-18 NOTE — Telephone Encounter (Signed)
Rx refill request: clonazepam 0.5 mg       Last filled: 02/17/18  LOV: 02/17/18          03/24/18 (acute)  PCP: Baity  Pharmacy: verified

## 2018-07-14 ENCOUNTER — Encounter: Payer: Self-pay | Admitting: Internal Medicine

## 2018-07-14 ENCOUNTER — Ambulatory Visit (INDEPENDENT_AMBULATORY_CARE_PROVIDER_SITE_OTHER)
Admission: RE | Admit: 2018-07-14 | Discharge: 2018-07-14 | Disposition: A | Discharge status: Home / Self Care | Payer: 59 | Source: Ambulatory Visit | Attending: Internal Medicine | Admitting: Internal Medicine

## 2018-07-14 ENCOUNTER — Ambulatory Visit (INDEPENDENT_AMBULATORY_CARE_PROVIDER_SITE_OTHER): Payer: 59 | Admitting: Internal Medicine

## 2018-07-14 VITALS — BP 126/84 | HR 61 | Temp 98.2°F | Ht 68.5 in | Wt 200.0 lb

## 2018-07-14 DIAGNOSIS — F419 Anxiety disorder, unspecified: Secondary | ICD-10-CM | POA: Diagnosis not present

## 2018-07-14 DIAGNOSIS — F5101 Primary insomnia: Secondary | ICD-10-CM

## 2018-07-14 DIAGNOSIS — E559 Vitamin D deficiency, unspecified: Secondary | ICD-10-CM | POA: Diagnosis not present

## 2018-07-14 DIAGNOSIS — G8929 Other chronic pain: Secondary | ICD-10-CM

## 2018-07-14 DIAGNOSIS — M545 Low back pain, unspecified: Secondary | ICD-10-CM | POA: Insufficient documentation

## 2018-07-14 DIAGNOSIS — K219 Gastro-esophageal reflux disease without esophagitis: Secondary | ICD-10-CM | POA: Diagnosis not present

## 2018-07-14 DIAGNOSIS — Z23 Encounter for immunization: Secondary | ICD-10-CM

## 2018-07-14 DIAGNOSIS — Z0001 Encounter for general adult medical examination with abnormal findings: Secondary | ICD-10-CM

## 2018-07-14 DIAGNOSIS — Z79899 Other long term (current) drug therapy: Secondary | ICD-10-CM | POA: Diagnosis not present

## 2018-07-14 MED ORDER — CLONAZEPAM 0.5 MG PO TABS: 0.5000 mg | ORAL_TABLET | Freq: Every day | ORAL | 0 refills | Status: DC | PRN

## 2018-07-14 MED ORDER — CYCLOBENZAPRINE HCL 5 MG PO TABS: 5.0000 mg | ORAL_TABLET | Freq: Every day | ORAL | 0 refills | Status: DC | PRN

## 2018-07-14 NOTE — Patient Instructions (Signed)
Health Maintenance for Postmenopausal Women Menopause is a normal process in which your reproductive ability comes to an end. This process happens gradually over a span of months to years, usually between the ages of 22 and 9. Menopause is complete when you have missed 12 consecutive menstrual periods. It is important to talk with your health care provider about some of the most common conditions that affect postmenopausal women, such as heart disease, cancer, and bone loss (osteoporosis). Adopting a healthy lifestyle and getting preventive care can help to promote your health and wellness. Those actions can also lower your chances of developing some of these common conditions. What should I know about menopause? During menopause, you may experience a number of symptoms, such as:  Moderate-to-severe hot flashes.  Night sweats.  Decrease in sex drive.  Mood swings.  Headaches.  Tiredness.  Irritability.  Memory problems.  Insomnia.  Choosing to treat or not to treat menopausal changes is an individual decision that you make with your health care provider. What should I know about hormone replacement therapy and supplements? Hormone therapy products are effective for treating symptoms that are associated with menopause, such as hot flashes and night sweats. Hormone replacement carries certain risks, especially as you become older. If you are thinking about using estrogen or estrogen with progestin treatments, discuss the benefits and risks with your health care provider. What should I know about heart disease and stroke? Heart disease, heart attack, and stroke become more likely as you age. This may be due, in part, to the hormonal changes that your body experiences during menopause. These can affect how your body processes dietary fats, triglycerides, and cholesterol. Heart attack and stroke are both medical emergencies. There are many things that you can do to help prevent heart disease  and stroke:  Have your blood pressure checked at least every 1-2 years. High blood pressure causes heart disease and increases the risk of stroke.  If you are 53-22 years old, ask your health care provider if you should take aspirin to prevent a heart attack or a stroke.  Do not use any tobacco products, including cigarettes, chewing tobacco, or electronic cigarettes. If you need help quitting, ask your health care provider.  It is important to eat a healthy diet and maintain a healthy weight. ? Be sure to include plenty of vegetables, fruits, low-fat dairy products, and lean protein. ? Avoid eating foods that are high in solid fats, added sugars, or salt (sodium).  Get regular exercise. This is one of the most important things that you can do for your health. ? Try to exercise for at least 150 minutes each week. The type of exercise that you do should increase your heart rate and make you sweat. This is known as moderate-intensity exercise. ? Try to do strengthening exercises at least twice each week. Do these in addition to the moderate-intensity exercise.  Know your numbers.Ask your health care provider to check your cholesterol and your blood glucose. Continue to have your blood tested as directed by your health care provider.  What should I know about cancer screening? There are several types of cancer. Take the following steps to reduce your risk and to catch any cancer development as early as possible. Breast Cancer  Practice breast self-awareness. ? This means understanding how your breasts normally appear and feel. ? It also means doing regular breast self-exams. Let your health care provider know about any changes, no matter how small.  If you are 40  or older, have a clinician do a breast exam (clinical breast exam or CBE) every year. Depending on your age, family history, and medical history, it may be recommended that you also have a yearly breast X-ray (mammogram).  If you  have a family history of breast cancer, talk with your health care provider about genetic screening.  If you are at high risk for breast cancer, talk with your health care provider about having an MRI and a mammogram every year.  Breast cancer (BRCA) gene test is recommended for women who have family members with BRCA-related cancers. Results of the assessment will determine the need for genetic counseling and BRCA1 and for BRCA2 testing. BRCA-related cancers include these types: ? Breast. This occurs in males or females. ? Ovarian. ? Tubal. This may also be called fallopian tube cancer. ? Cancer of the abdominal or pelvic lining (peritoneal cancer). ? Prostate. ? Pancreatic.  Cervical, Uterine, and Ovarian Cancer Your health care provider may recommend that you be screened regularly for cancer of the pelvic organs. These include your ovaries, uterus, and vagina. This screening involves a pelvic exam, which includes checking for microscopic changes to the surface of your cervix (Pap test).  For women ages 21-65, health care providers may recommend a pelvic exam and a Pap test every three years. For women ages 79-65, they may recommend the Pap test and pelvic exam, combined with testing for human papilloma virus (HPV), every five years. Some types of HPV increase your risk of cervical cancer. Testing for HPV may also be done on women of any age who have unclear Pap test results.  Other health care providers may not recommend any screening for nonpregnant women who are considered low risk for pelvic cancer and have no symptoms. Ask your health care provider if a screening pelvic exam is right for you.  If you have had past treatment for cervical cancer or a condition that could lead to cancer, you need Pap tests and screening for cancer for at least 20 years after your treatment. If Pap tests have been discontinued for you, your risk factors (such as having a new sexual partner) need to be  reassessed to determine if you should start having screenings again. Some women have medical problems that increase the chance of getting cervical cancer. In these cases, your health care provider may recommend that you have screening and Pap tests more often.  If you have a family history of uterine cancer or ovarian cancer, talk with your health care provider about genetic screening.  If you have vaginal bleeding after reaching menopause, tell your health care provider.  There are currently no reliable tests available to screen for ovarian cancer.  Lung Cancer Lung cancer screening is recommended for adults 69-62 years old who are at high risk for lung cancer because of a history of smoking. A yearly low-dose CT scan of the lungs is recommended if you:  Currently smoke.  Have a history of at least 30 pack-years of smoking and you currently smoke or have quit within the past 15 years. A pack-year is smoking an average of one pack of cigarettes per day for one year.  Yearly screening should:  Continue until it has been 15 years since you quit.  Stop if you develop a health problem that would prevent you from having lung cancer treatment.  Colorectal Cancer  This type of cancer can be detected and can often be prevented.  Routine colorectal cancer screening usually begins at  age 42 and continues through age 45.  If you have risk factors for colon cancer, your health care provider may recommend that you be screened at an earlier age.  If you have a family history of colorectal cancer, talk with your health care provider about genetic screening.  Your health care provider may also recommend using home test kits to check for hidden blood in your stool.  A small camera at the end of a tube can be used to examine your colon directly (sigmoidoscopy or colonoscopy). This is done to check for the earliest forms of colorectal cancer.  Direct examination of the colon should be repeated every  5-10 years until age 71. However, if early forms of precancerous polyps or small growths are found or if you have a family history or genetic risk for colorectal cancer, you may need to be screened more often.  Skin Cancer  Check your skin from head to toe regularly.  Monitor any moles. Be sure to tell your health care provider: ? About any new moles or changes in moles, especially if there is a change in a mole's shape or color. ? If you have a mole that is larger than the size of a pencil eraser.  If any of your family members has a history of skin cancer, especially at a young age, talk with your health care provider about genetic screening.  Always use sunscreen. Apply sunscreen liberally and repeatedly throughout the day.  Whenever you are outside, protect yourself by wearing long sleeves, pants, a wide-brimmed hat, and sunglasses.  What should I know about osteoporosis? Osteoporosis is a condition in which bone destruction happens more quickly than new bone creation. After menopause, you may be at an increased risk for osteoporosis. To help prevent osteoporosis or the bone fractures that can happen because of osteoporosis, the following is recommended:  If you are 46-71 years old, get at least 1,000 mg of calcium and at least 600 mg of vitamin D per day.  If you are older than age 55 but younger than age 65, get at least 1,200 mg of calcium and at least 600 mg of vitamin D per day.  If you are older than age 54, get at least 1,200 mg of calcium and at least 800 mg of vitamin D per day.  Smoking and excessive alcohol intake increase the risk of osteoporosis. Eat foods that are rich in calcium and vitamin D, and do weight-bearing exercises several times each week as directed by your health care provider. What should I know about how menopause affects my mental health? Depression may occur at any age, but it is more common as you become older. Common symptoms of depression  include:  Low or sad mood.  Changes in sleep patterns.  Changes in appetite or eating patterns.  Feeling an overall lack of motivation or enjoyment of activities that you previously enjoyed.  Frequent crying spells.  Talk with your health care provider if you think that you are experiencing depression. What should I know about immunizations? It is important that you get and maintain your immunizations. These include:  Tetanus, diphtheria, and pertussis (Tdap) booster vaccine.  Influenza every year before the flu season begins.  Pneumonia vaccine.  Shingles vaccine.  Your health care provider may also recommend other immunizations. This information is not intended to replace advice given to you by your health care provider. Make sure you discuss any questions you have with your health care provider. Document Released: 10/15/2005  Document Revised: 03/12/2016 Document Reviewed: 05/27/2015 Elsevier Interactive Patient Education  2018 [employer]  

## 2018-07-14 NOTE — Assessment & Plan Note (Signed)
Xray lumbar spine today Continue Ibuprofen Flexeril refilled today Encouraged regular stretching, core strengthening and weight loss

## 2018-07-14 NOTE — Assessment & Plan Note (Signed)
Failed Trazadone Encouraged routine sleep regimen Can try Melatonin OTC

## 2018-07-14 NOTE — Assessment & Plan Note (Signed)
Stable on prn Clonazepam, refilled today Support offered

## 2018-07-14 NOTE — Assessment & Plan Note (Signed)
CBC and CMET today Stable on Nexium Encouraged weight loss

## 2018-07-14 NOTE — Progress Notes (Signed)
Subjective:    Patient ID: Katie Townsend, female    DOB: April 08, 1961, 57 y.o.   MRN: [id number]  HPI  Pt presents to the clinic today for her annual exam. She is also due to follow up chronic conditions.  Anxiety: Triggered by general life stress. She takes Clonazepam 2 x week as needed. She has failed Prozac, Paxil, Pristiq, Zoloft and Celexa in the past. She has seen psych in the past but not recently.  There is no CSA or UDS on file.  GERD: She is not sure what triggers this. She denies breakthrough on Esomeprazole. There is no upper GI on file.   Insomnia: She has trouble staying asleep. She does not take Trazadone because it was not effective. There is no sleep study on file.  Osteoarthritis: Mainly in her lower back. She denies any injury to the area. She is taking Ibuprofen as needed. She has had an xray of her back 10 years ago.   Flu: 05/2017 Tetanus: 04/2013 Pap Smear: 2011, hysterectomy Mammogram: 11/2017 Colon Screening: 2012 Vision Screening: annually Dentist: annually  Diet: She does eat meat. She consumes fruits and veggies daily. She does eat some fried foods. She drinks mostly water, lemonade, coffee. Exercise: Walking  Review of Systems      Past Medical History:  Diagnosis Date  . Depression   . GERD (gastroesophageal reflux disease)     Current Outpatient Medications  Medication Sig Dispense Refill  . clonazePAM (KLONOPIN) 0.5 MG tablet Take 1 tablet (0.5 mg total) by mouth daily as needed for anxiety. 30 tablet 0  . cyclobenzaprine (FLEXERIL) 5 MG tablet Take 1 tablet (5 mg total) by mouth 3 (three) times daily as needed for muscle spasms. 30 tablet 1  . diphenhydrAMINE (BENADRYL) 25 MG tablet Take 25 mg by mouth at bedtime as needed.    Marland Kitchen esomeprazole (NEXIUM) 20 MG capsule Take 40 mg by mouth daily at 12 noon.     . meloxicam (MOBIC) 15 MG tablet Take 1 tablet (15 mg total) by mouth daily. 30 tablet 0  . traZODone (DESYREL) 50 MG tablet Take 0.5-1  tablets (25-50 mg total) by mouth at bedtime as needed for sleep. 30 tablet 0   No current facility-administered medications for this visit.     Allergies  Allergen Reactions  . Codeine Nausea Only    Family History  Problem Relation Age of Onset  . Arthritis Mother   . Hyperlipidemia Mother   . Heart disease Mother   . Hypertension Mother   . Anxiety disorder Mother   . Diabetes Mother   . Alcohol abuse Father   . Arthritis Father   . Mental illness Father   . Diabetes Sister   . Diabetes Brother   . Diabetes Maternal Aunt     Social History   Socioeconomic History  . Marital status: Married    Spouse name: Not on file  . Number of children: Not on file  . Years of education: Not on file  . Highest education level: Not on file  Occupational History  . Not on file  Social Needs  . Financial resource strain: Not on file  . Food insecurity:    Worry: Not on file    Inability: Not on file  . Transportation needs:    Medical: Not on file    Non-medical: Not on file  Tobacco Use  . Smoking status: Never Smoker  . Smokeless tobacco: Never Used  Substance and Sexual Activity  .  Alcohol use: No    Alcohol/week: 0.0 standard drinks  . Drug use: No  . Sexual activity: Yes    Birth control/protection: Condom, Surgical  Lifestyle  . Physical activity:    Days per week: Not on file    Minutes per session: Not on file  . Stress: Not on file  Relationships  . Social connections:    Talks on phone: Not on file    Gets together: Not on file    Attends religious service: Not on file    Active member of club or organization: Not on file    Attends meetings of clubs or organizations: Not on file    Relationship status: Not on file  . Intimate partner violence:    Fear of current or ex partner: Not on file    Emotionally abused: Not on file    Physically abused: Not on file    Forced sexual activity: Not on file  Other Topics Concern  . Not on file  Social History  Narrative  . Not on file     Constitutional: Denies fever, malaise, fatigue, headache or abrupt weight changes.  HEENT: Denies eye pain, eye redness, ear pain, ringing in the ears, wax buildup, runny nose, nasal congestion, bloody nose, or sore throat. Respiratory: Denies difficulty breathing, shortness of breath, cough or sputum production.   Cardiovascular: Denies chest pain, chest tightness, palpitations or swelling in the hands or feet.  Gastrointestinal: Denies abdominal pain, bloating, constipation, diarrhea or blood in the stool.  GU: Denies urgency, frequency, pain with urination, burning sensation, blood in urine, odor or discharge. Musculoskeletal: Pt reports low back pain. Denies decrease in range of motion, difficulty with gait, muscle pain or joint swelling.  Skin: Denies redness, rashes, lesions or ulcercations.  Neurological: Pt reports insomnia. Denies dizziness, difficulty with memory, difficulty with speech or problems with balance and coordination.  Psych: Pt reports anxiety. Denies depression, SI/HI.  No other specific complaints in a complete review of systems (except as listed in HPI above).  Objective:   Physical Exam   BP 126/84   Pulse 61   Temp 98.2 F (36.8 C) (Oral)   Ht 5' 8.5" (1.74 m)   Wt 200 lb (90.7 kg)   SpO2 98%   BMI 29.97 kg/m  Wt Readings from Last 3 Encounters:  07/14/18 200 lb (90.7 kg)  03/24/18 201 lb (91.2 kg)  02/17/18 202 lb 12 oz (92 kg)    General: Appears her stated age, obese, in NAD. Skin: Warm, dry and intact. No rashes, lesions or ulcerations noted. HEENT: Head: normal shape and size; Eyes: sclera white, no icterus, conjunctiva pink, PERRLA and EOMs intact; Ears: Tm's gray and intact, normal light reflex;  Throat/Mouth: Teeth present, mucosa pink and moist, no exudate, lesions or ulcerations noted.  Neck:  Neck supple, trachea midline. No masses, lumps or thyromegaly present.  Cardiovascular: Normal rate and rhythm. S1,S2  noted.  No murmur, rubs or gallops noted. No JVD or BLE edema. No carotid bruits noted. Pulmonary/Chest: Normal effort and positive vesicular breath sounds. No respiratory distress. No wheezes, rales or ronchi noted.  Abdomen: Soft and nontender. Normal bowel sounds. No distention or masses noted. Liver, spleen and kidneys non palpable. Musculoskeletal: Tender to palpation over the lumbar spine. Strength 5/5 BUE/BLE. No difficulty with gait.  Neurological: Alert and oriented. Cranial nerves II-XII grossly intact. Coordination normal.  Psychiatric: Mood and affect normal. Behavior is normal. Judgment and thought content normal.  BMET    Component Value Date/Time   NA 137 06/03/2017 1509   K 3.8 06/03/2017 1509   CL 100 06/03/2017 1509   CO2 31 06/03/2017 1509   GLUCOSE 102 (H) 06/03/2017 1509   BUN 10 06/03/2017 1509   CREATININE 0.69 06/03/2017 1509   CALCIUM 9.6 06/03/2017 1509   GFRNONAA >60 02/16/2015 0245   GFRAA >60 02/16/2015 0245    Lipid Panel     Component Value Date/Time   CHOL 218 (H) 06/03/2017 1509   TRIG 182.0 (H) 06/03/2017 1509   HDL 43.30 06/03/2017 1509   CHOLHDL 5 06/03/2017 1509   VLDL 36.4 06/03/2017 1509   LDLCALC 139 (H) 06/03/2017 1509    CBC    Component Value Date/Time   WBC 8.6 06/03/2017 1509   RBC 4.00 06/03/2017 1509   HGB 12.8 06/03/2017 1509   HCT 37.9 06/03/2017 1509   PLT 265.0 06/03/2017 1509   MCV 94.7 06/03/2017 1509   MCH 31.9 02/16/2015 0245   MCHC 33.9 06/03/2017 1509   RDW 13.2 06/03/2017 1509    Hgb A1C No results found for: HGBA1C         Assessment & Plan:   Preventative Health Maintenance:  Flu shot today Tetanus UTD No longer needs pap smears Mammogram UTD Colon screening UTD Encouraged her to consume a balanced diet and exercise regimen Advised her to see an eye doctor and dentist annually Will check CBC, CMET, Lipid, A1C and Vit D today  RTC in 1 year, sooner if needed Webb Silversmith, NP

## 2018-07-15 LAB — COMPREHENSIVE METABOLIC PANEL
AG RATIO: 1.8 (calc) (ref 1.0–2.5)
ALT: 28 U/L (ref 6–29)
AST: 21 U/L (ref 10–35)
Albumin: 4.5 g/dL (ref 3.6–5.1)
Alkaline phosphatase (APISO): 71 U/L (ref 33–130)
BUN: 12 mg/dL (ref 7–25)
CALCIUM: 10.2 mg/dL (ref 8.6–10.4)
CHLORIDE: 106 mmol/L (ref 98–110)
CO2: 30 mmol/L (ref 20–32)
Creat: 0.83 mg/dL (ref 0.50–1.05)
GLOBULIN: 2.5 g/dL (ref 1.9–3.7)
Glucose, Bld: 95 mg/dL (ref 65–99)
Potassium: 4.9 mmol/L (ref 3.5–5.3)
SODIUM: 145 mmol/L (ref 135–146)
Total Bilirubin: 1 mg/dL (ref 0.2–1.2)
Total Protein: 7 g/dL (ref 6.1–8.1)

## 2018-07-15 LAB — PAIN MGMT, PROFILE 8 W/CONF, U
6 Acetylmorphine: NEGATIVE ng/mL (ref <10)
Alcohol Metabolites: NEGATIVE ng/mL (ref <500)
Amphetamines: NEGATIVE ng/mL (ref <500)
Benzodiazepines: NEGATIVE ng/mL (ref <100)
Buprenorphine, Urine: NEGATIVE ng/mL (ref <5)
Cocaine Metabolite: NEGATIVE ng/mL (ref <150)
Creatinine: 128.4 mg/dL
MDMA: NEGATIVE ng/mL (ref <500)
Marijuana Metabolite: NEGATIVE ng/mL (ref <20)
OPIATES: NEGATIVE ng/mL (ref <100)
Oxidant: NEGATIVE ug/mL (ref <200)
Oxycodone: NEGATIVE ng/mL (ref <100)
PH: 6.68 (ref 4.5–9.0)

## 2018-07-15 LAB — CBC
HEMATOCRIT: 36.6 % (ref 35.0–45.0)
Hemoglobin: 12.6 g/dL (ref 11.7–15.5)
MCH: 31.7 pg (ref 27.0–33.0)
MCHC: 34.4 g/dL (ref 32.0–36.0)
MCV: 92 fL (ref 80.0–100.0)
MPV: 10.4 fL (ref 7.5–12.5)
Platelets: 272 10*3/uL (ref 140–400)
RBC: 3.98 10*6/uL (ref 3.80–5.10)
RDW: 12.1 % (ref 11.0–15.0)
WBC: 7.7 10*3/uL (ref 3.8–10.8)

## 2018-07-15 LAB — LIPID PANEL
CHOL/HDL RATIO: 5.1 (calc) — AB (ref <5.0)
CHOLESTEROL: 224 mg/dL — AB (ref <200)
HDL: 44 mg/dL — AB (ref >50)
LDL CHOLESTEROL (CALC): 153 mg/dL — AB
Non-HDL Cholesterol (Calc): 180 mg/dL (calc) — ABNORMAL HIGH (ref <130)
Triglycerides: 143 mg/dL (ref <150)

## 2018-07-15 LAB — HEMOGLOBIN A1C
EAG (MMOL/L): 6 (calc)
Hgb A1c MFr Bld: 5.4 % of total Hgb (ref <5.7)
Mean Plasma Glucose: 108 (calc)

## 2018-07-15 LAB — VITAMIN D 25 HYDROXY (VIT D DEFICIENCY, FRACTURES): Vit D, 25-Hydroxy: 17 ng/mL — ABNORMAL LOW (ref 30–100)

## 2018-07-17 ENCOUNTER — Other Ambulatory Visit: Payer: Self-pay | Admitting: Internal Medicine

## 2018-07-17 DIAGNOSIS — E7800 Pure hypercholesterolemia, unspecified: Secondary | ICD-10-CM

## 2018-07-17 DIAGNOSIS — E559 Vitamin D deficiency, unspecified: Secondary | ICD-10-CM

## 2018-07-17 MED ORDER — VITAMIN D (ERGOCALCIFEROL) 1.25 MG (50000 UNIT) PO CAPS: 50000.0000 [IU] | ORAL_CAPSULE | ORAL | 0 refills | Status: DC

## 2018-07-25 ENCOUNTER — Telehealth: Payer: Self-pay

## 2018-07-25 NOTE — Telephone Encounter (Signed)
-----   Message from Jearld Fenton, NP sent at 07/17/2018  8:00 AM EST ----- Call pt:  Xray c/w arthritis in the lumbar spine. This is a progressive condition that usually doesn't improve. Encourage weight loss and core strengthening to take pressure off the back. Daily stretching, heat, massage and NSAID's will help.

## 2018-07-25 NOTE — Telephone Encounter (Signed)
Left detailed msg on VM per HIPAA  

## 2018-08-22 ENCOUNTER — Other Ambulatory Visit: Payer: Self-pay | Admitting: Internal Medicine

## 2018-08-23 MED ORDER — CYCLOBENZAPRINE HCL 5 MG PO TABS: 5.0000 mg | ORAL_TABLET | Freq: Every day | ORAL | 0 refills | Status: DC | PRN

## 2018-08-23 NOTE — Telephone Encounter (Signed)
Last filled 07/14/18 at CPE visit... please advise

## 2018-10-06 ENCOUNTER — Other Ambulatory Visit: Payer: Self-pay | Admitting: Internal Medicine

## 2018-10-06 MED ORDER — CLONAZEPAM 0.5 MG PO TABS: 0.5000 mg | ORAL_TABLET | Freq: Every day | ORAL | 0 refills | Status: DC | PRN

## 2018-10-06 NOTE — Telephone Encounter (Signed)
Last filled 07/14/18... please advise

## 2018-12-21 ENCOUNTER — Other Ambulatory Visit: Payer: Self-pay | Admitting: Internal Medicine

## 2018-12-21 NOTE — Telephone Encounter (Signed)
Name of Medication:cyclobenzaprine 5 mg  Name of Pharmacy: [facility] [address] Last Fill or Written Date and Quantity:# 30 on 08/23/18  Last Office Visit and Type:07/14/18 annual  Next Office Visit and Type: none scheduled Last Controlled Substance Agreement Date:06/03/17  Last UDS:07/14/18   Name of Medication:clonazepam 0.5 mg Name of Pharmacy: [facility] [address] Last Fill or Written Date and Quantity:# 1 on 10/06/18 Last Office Visit and Type:07/14/18 annual  Next Office Visit and Type: none scheduled Last Controlled Substance Agreement Date:06/03/17  Last UDS:07/14/18

## 2018-12-22 MED ORDER — CYCLOBENZAPRINE HCL 5 MG PO TABS: 5.0000 mg | ORAL_TABLET | Freq: Every day | ORAL | 0 refills | Status: DC | PRN

## 2018-12-22 MED ORDER — CLONAZEPAM 0.5 MG PO TABS: 0.5000 mg | ORAL_TABLET | Freq: Every day | ORAL | 0 refills | Status: DC | PRN

## 2019-05-18 ENCOUNTER — Other Ambulatory Visit: Payer: Self-pay | Admitting: Internal Medicine

## 2019-05-21 MED ORDER — CLONAZEPAM 0.5 MG PO TABS: 0.5000 mg | ORAL_TABLET | Freq: Every day | ORAL | 0 refills | Status: DC | PRN

## 2019-05-21 NOTE — Telephone Encounter (Signed)
Last filled 12-22-18 #30 Last OV/CPE  07-24-18 No Future [place]

## 2019-05-30 ENCOUNTER — Other Ambulatory Visit: Payer: Self-pay

## 2019-05-30 ENCOUNTER — Ambulatory Visit (INDEPENDENT_AMBULATORY_CARE_PROVIDER_SITE_OTHER): Payer: 59

## 2019-05-30 DIAGNOSIS — Z23 Encounter for immunization: Secondary | ICD-10-CM

## 2019-12-03 ENCOUNTER — Encounter: Payer: Self-pay | Admitting: Gastroenterology

## 2019-12-03 ENCOUNTER — Other Ambulatory Visit: Payer: Self-pay

## 2019-12-03 ENCOUNTER — Encounter (HOSPITAL_COMMUNITY): Payer: Self-pay | Admitting: Family Medicine

## 2019-12-03 ENCOUNTER — Ambulatory Visit (HOSPITAL_COMMUNITY)
Admission: EM | Admit: 2019-12-03 | Discharge: 2019-12-03 | Disposition: A | Discharge status: Home / Self Care | Payer: 59 | Attending: Family Medicine | Admitting: Family Medicine

## 2019-12-03 DIAGNOSIS — K2970 Gastritis, unspecified, without bleeding: Secondary | ICD-10-CM | POA: Diagnosis not present

## 2019-12-03 MED ORDER — ALUM & MAG HYDROXIDE-SIMETH 200-200-20 MG/5ML PO SUSP
30.0000 mL | Freq: Once | ORAL | Status: AC
  Administered 2019-12-03: 30 mL via ORAL

## 2019-12-03 MED ORDER — LIDOCAINE VISCOUS HCL 2 % MT SOLN
15.0000 mL | Freq: Once | OROMUCOSAL | Status: AC
  Administered 2019-12-03: 15 mL via ORAL

## 2019-12-03 MED ORDER — LIDOCAINE VISCOUS HCL 2 % MT SOLN
OROMUCOSAL | Status: AC
  Filled 2019-12-03: qty 15

## 2019-12-03 MED ORDER — ALUM & MAG HYDROXIDE-SIMETH 200-200-20 MG/5ML PO SUSP
ORAL | Status: AC
  Filled 2019-12-03: qty 30

## 2019-12-03 NOTE — Discharge Instructions (Addendum)
I believe this is most likely gastritis Keep taking the Nexium daily You can take Maalox as needed for worsening heartburn and abdominal pain I put in a referral to GI specialist.  They should be calling you to make an appointment. You need to watch your  diet and avoid foods that are spicy, greasy, fried and fatty.  Also may want to limit caffeine, milk and acidic sauces like tomato sauce. Avoid lying flat after eating.  If you start developing worsening left chest pain or become shortness of breath, nauseous or sweating you need to go to the ER.

## 2019-12-03 NOTE — ED Provider Notes (Signed)
[facility]    CSN: [id number] Arrival date & time: 12/03/19  [id number]      History   Chief Complaint Chief Complaint  Patient presents with  . Epigastric Pain  . Fatigue  . Back Pain    Upper    HPI Katie Townsend is a 59 y.o. female.   Pt is a 59 year old female with PMH of depression, GERD, anxiety, insomnia, back pain. She presents with recurrent epigastric pain, heartburn, that is unrelieved with medication. Pain in middle to upper back with fatigue and pain under the left breast since yeserday.  Symptoms have been constant, waxing waning.  She takes Nexium once to twice daily as needed for heartburn or acid reflux.  Denies any nausea, vomiting or diarrhea.  Denies any constipation.  Denies any blood in stool.  No cough, chest congestion or shortness of breath.  No fever.  Suffers from low vitamin D but she has been take vitamin D daily.  She does not drink alcohol or smoke.  ROS per HPI       Past Medical History:  Diagnosis Date  . Depression   . GERD (gastroesophageal reflux disease)     Patient Active Problem List   Diagnosis Date Noted  . Chronic midline low back pain without sciatica 07/14/2018  . Insomnia 02/18/2018  . GERD (gastroesophageal reflux disease) 08/21/2014  . Anxiety 08/21/2014    Past Surgical History:  Procedure Laterality Date  . ABDOMINAL HYSTERECTOMY  2009  . APPENDECTOMY  1978  . EYE SURGERY Right 1976    OB History   No obstetric history on file.      Home Medications    Prior to Admission medications   Medication Sig Start Date End Date Taking? Authorizing Provider  clonazePAM (KLONOPIN) 0.5 MG tablet Take 1 tablet (0.5 mg total) by mouth daily as needed for anxiety. 05/21/19   Venia Carbon, MD  cyclobenzaprine (FLEXERIL) 5 MG tablet Take 1 tablet (5 mg total) by mouth daily as needed for muscle spasms. 12/22/18   Jearld Fenton, NP  diphenhydrAMINE (BENADRYL) 25 MG tablet Take 25 mg by mouth at bedtime as  needed.    [provider]  esomeprazole (NEXIUM) 20 MG capsule Take 40 mg by mouth daily at 12 noon.     [provider]  Vitamin D, Ergocalciferol, (DRISDOL) 1.25 MG (50000 UT) CAPS capsule Take 1 capsule (50,000 Units total) by mouth every 7 (seven) days. 07/17/18   Jearld Fenton, NP    Family History Family History  Problem Relation Age of Onset  . Arthritis Mother   . Hyperlipidemia Mother   . Heart disease Mother   . Hypertension Mother   . Anxiety disorder Mother   . Diabetes Mother   . Alcohol abuse Father   . Arthritis Father   . Mental illness Father   . Diabetes Sister   . Diabetes Brother   . Diabetes Maternal Aunt     Social History Social History   Tobacco Use  . Smoking status: Never Smoker  . Smokeless tobacco: Never Used  Substance Use Topics  . Alcohol use: No    Alcohol/week: 0.0 standard drinks  . Drug use: No     Allergies   Codeine   Review of Systems Review of Systems   Physical Exam Triage Vital Signs ED Triage Vitals  Enc Vitals Group     BP 12/03/19 0824 (!) 158/104     Pulse Rate 12/03/19 0824  83     Resp 12/03/19 0824 17     Temp 12/03/19 0824 98.5 F (36.9 C)     Temp Source 12/03/19 0824 Oral     SpO2 12/03/19 0824 97 %     Weight --      Height --      Head Circumference --      Peak Flow --      Pain Score 12/03/19 0825 4     Pain Loc --      Pain Edu? --      Excl. in [place]? --    No data found.  Updated Vital Signs BP (!) 148/82 (BP Location: Left Arm)   Pulse 83   Temp 98.5 F (36.9 C) (Oral)   Resp 17   SpO2 97%   Visual Acuity Right Eye Distance:   Left Eye Distance:   Bilateral Distance:    Right Eye Near:   Left Eye Near:    Bilateral Near:     Physical Exam Vitals and nursing note reviewed.  Constitutional:      General: She is not in acute distress.    Appearance: Normal appearance. She is not ill-appearing, toxic-appearing or diaphoretic.  HENT:     Head: Normocephalic.       Nose: Nose normal.     Mouth/Throat:     Pharynx: Oropharynx is clear.  Eyes:     Conjunctiva/sclera: Conjunctivae normal.  Cardiovascular:     Rate and Rhythm: Normal rate and regular rhythm.     Pulses: Normal pulses.     Heart sounds: Normal heart sounds.  Pulmonary:     Effort: Pulmonary effort is normal.  Abdominal:     Palpations: Abdomen is soft.     Tenderness: There is no abdominal tenderness.     Comments: Nontender to deep palpation of entire abdomen.  Musculoskeletal:        General: Normal range of motion.     Cervical back: Normal range of motion.  Skin:    General: Skin is warm and dry.     Findings: No rash.  Neurological:     Mental Status: She is alert.  Psychiatric:        Mood and Affect: Mood normal.      UC Treatments / Results  Labs (all labs ordered are listed, but only abnormal results are displayed) Labs Reviewed - No data to display  EKG   Radiology No results found.  Procedures Procedures (including critical care time)  Medications Ordered in UC Medications  alum & mag hydroxide-simeth (MAALOX/MYLANTA) 200-200-20 MG/5ML suspension 30 mL (30 mLs Oral Given 12/03/19 0856)    And  lidocaine (XYLOCAINE) 2 % viscous mouth solution 15 mL (15 mLs Oral Given 12/03/19 0857)    Initial Impression / Assessment and Plan / UC Course  I have reviewed the triage vital signs and the nursing notes.  Pertinent labs & imaging results that were available during my care of the patient were reviewed by me and considered in my medical decision making (see chart for details).     Gastritis-most likely diagnosis.  Physical exam completely benign.  Vital signs are normal and she is nontoxic or ill-appearing. EKG with normal sinus rhythm and normal rate. Not convinced of ACS at this time. GI cocktail given here in clinic.  We will have her continue taking her Pepcid daily.  Recommended taking Maalox intermittently as needed Diet precautions  given Referral given to specialist and recommended follow-up  with her primary care provider for possible stress test if this continues.  ER return precautions given Final Clinical Impressions(s) / UC Diagnoses   Final diagnoses:  Gastritis without bleeding, unspecified chronicity, unspecified gastritis type     Discharge Instructions     I believe this is most likely gastritis Keep taking the Nexium daily You can take Maalox as needed for worsening heartburn and abdominal pain I put in a referral to GI specialist.  They should be calling you to make an appointment. You need to watch your  diet and avoid foods that are spicy, greasy, fried and fatty.  Also may want to limit caffeine, milk and acidic sauces like tomato sauce. Avoid lying flat after eating.  If you start developing worsening left chest pain or become shortness of breath, nauseous or sweating you need to go to the ER.     ED Prescriptions    None     PDMP not reviewed this encounter.   Orvan July, NP 12/03/19 [phone]

## 2019-12-03 NOTE — ED Triage Notes (Signed)
Pt presents with recurrent epigastric pain (heartburn) that is unrelieved with prescribed medication, middle to upper back pain, fatigue, and pain under left breast since yesterday.

## 2019-12-31 DIAGNOSIS — R1013 Epigastric pain: Secondary | ICD-10-CM | POA: Insufficient documentation

## 2020-01-01 ENCOUNTER — Encounter: Payer: Self-pay | Admitting: Gastroenterology

## 2020-01-01 ENCOUNTER — Ambulatory Visit (INDEPENDENT_AMBULATORY_CARE_PROVIDER_SITE_OTHER): Payer: 59 | Admitting: Gastroenterology

## 2020-01-01 VITALS — BP 136/74 | HR 85 | Temp 97.9°F | Ht 68.5 in | Wt 200.0 lb

## 2020-01-01 DIAGNOSIS — K219 Gastro-esophageal reflux disease without esophagitis: Secondary | ICD-10-CM

## 2020-01-01 MED ORDER — FAMOTIDINE 20 MG PO TABS: ORAL_TABLET | ORAL | Status: AC

## 2020-01-01 MED ORDER — ESOMEPRAZOLE MAGNESIUM 20 MG PO CPDR: DELAYED_RELEASE_CAPSULE | ORAL | Status: AC

## 2020-01-01 NOTE — Progress Notes (Signed)
HPI: This is a very pleasant 59 year old woman who was referred to me by Loura Halt A, NP  to evaluate chronic GERD, intermittent diarrhea.    She has had trouble with acid regurgitation for many years.  She has actually been on over-the-counter strength Nexium 20 mg on a daily basis for many years.  This generally helps quite well however she does have intermittent breakthrough.  She takes the Nexium shortly after her breakfast meal on a daily basis.  She does not have any dysphagia, her weight has been stable, no overt GI bleeding.  She described a particularly bad time at her daughter's house laying on a flat bed where she had waterbrash several weeks ago.  Her bed at home is raised 6 inches.  She only drinks caffeine in the morning.  She is not an alcohol drinker.  She does periodically have chocolate.  She does not smoke cigarettes.  She is bothered to 3 times a month by loose stools, nonbloody.  This is been going on for years it sounds like  Old Data Reviewed: She presented to an urgent clinic about 1 month ago with epigastric pain, fatigue and back pain.  She was also having some heartburn.  She was felt to have gastritis.  They documented that the physical exam was "completely benign".    Right upper quadrant abdominal ultrasound January 2017 for "right upper quadrant abdominal pain" ordered by her primary care physician showed fatty liver but was otherwise normal with a normal gallbladder.    Review of systems: Pertinent positive and negative review of systems were noted in the above HPI section. All other review negative.   Past Medical History:  Diagnosis Date  . Arthritis   . Depression   . GERD (gastroesophageal reflux disease)     Past Surgical History:  Procedure Laterality Date  . ABDOMINAL HYSTERECTOMY  2009  . APPENDECTOMY  1978  . EYE SURGERY Right 1976    Current Outpatient Medications  Medication Sig Dispense Refill  . diphenhydrAMINE (BENADRYL) 25 MG  tablet Take 25 mg by mouth at bedtime as needed.    Marland Kitchen esomeprazole (NEXIUM) 20 MG capsule Take 20 mg by mouth daily at 12 noon.     . Vitamin D, Ergocalciferol, (DRISDOL) 1.25 MG (50000 UT) CAPS capsule Take 1 capsule (50,000 Units total) by mouth every 7 (seven) days. 12 capsule 0   No current facility-administered medications for this visit.    Allergies as of 01/01/2020 - Review Complete 01/01/2020  Allergen Reaction Noted  . Codeine Nausea Only 10/29/2012    Family History  Problem Relation Age of Onset  . Arthritis Mother   . Hyperlipidemia Mother   . Heart disease Mother   . Hypertension Mother   . Anxiety disorder Mother   . Diabetes Mother   . Alcohol abuse Father   . Arthritis Father   . Mental illness Father   . Diabetes Sister   . Diabetes Brother   . Diabetes Maternal Aunt     Social History   Socioeconomic History  . Marital status: Married    Spouse name: Not on file  . Number of children: Not on file  . Years of education: Not on file  . Highest education level: Not on file  Occupational History  . Not on file  Tobacco Use  . Smoking status: Never Smoker  . Smokeless tobacco: Never Used  Substance and Sexual Activity  . Alcohol use: No    Alcohol/week: 0.0 standard  drinks  . Drug use: No  . Sexual activity: Yes    Birth control/protection: Condom, Surgical  Other Topics Concern  . Not on file  Social History Narrative  . Not on file   Social Determinants of Health   Financial Resource Strain:   . Difficulty of Paying Living Expenses:   Food Insecurity:   . Worried About Charity fundraiser in the Last Year:   . Arboriculturist in the Last Year:   Transportation Needs:   . Film/video editor (Medical):   Marland Kitchen Lack of Transportation (Non-Medical):   Physical Activity:   . Days of Exercise per Week:   . Minutes of Exercise per Session:   Stress:   . Feeling of Stress :   Social Connections:   . Frequency of Communication with Friends  and Family:   . Frequency of Social Gatherings with Friends and Family:   . Attends Religious Services:   . Active Member of Clubs or Organizations:   . Attends Archivist Meetings:   Marland Kitchen Marital Status:   Intimate Partner Violence:   . Fear of Current or Ex-Partner:   . Emotionally Abused:   Marland Kitchen Physically Abused:   . Sexually Abused:      Physical Exam: BP 136/74   Pulse 85   Temp 97.9 F (36.6 C)   Ht 5' 8.5" (1.74 m)   Wt 200 lb (90.7 kg)   BMI 29.97 kg/m  Constitutional: generally well-appearing Psychiatric: alert and oriented x3 Eyes: extraocular movements intact Mouth: oral pharynx moist, no lesions Neck: supple no lymphadenopathy Cardiovascular: heart regular rate and rhythm Lungs: clear to auscultation bilaterally Abdomen: soft, nontender, nondistended, no obvious ascites, no peritoneal signs, normal bowel sounds Extremities: no lower extremity edema bilaterally Skin: no lesions on visible extremities   Assessment and plan: 59 y.o. female with chronic GERD without alarm symptoms, intermittent loose stools  First she is not taking her proton pump inhibitor at the correct time in relation to meals and so I asked her to adjust that.  She will therefore start taking her over-the-counter Nexium 20 mg pill shortly before breakfast.  I recommended she consider Pepcid, famotidine 20 mg pills at bedtime on an as-needed basis if she thinks that she is going to have difficulty with overnight acid symptoms.  She has intermittent loose stools.  Nonbloody.  These might be dietary related.  She is going to keep a food diary and she will return to see me in 6 to 8 weeks to discuss her food diary and see how she has done on her different antiacid regimen.  I did not mention above but she did have a colonoscopy in [place] she believes in 2012 and it was completely normal.  At her next office visit we will put her in our recall system for a colonoscopy January 2022.   Colon cancer does not run in her family.  Please see the "Patient Instructions" section for addition details about the plan.   Owens Loffler, MD [facility] 01/01/2020, 3:12 PM  Cc: Orvan July, NP  Total time on date of encounter was 45  minutes (this included time spent preparing to see the patient reviewing records; obtaining and/or reviewing separately obtained history; performing a medically appropriate exam and/or evaluation; counseling and educating the patient and family if present; ordering medications, tests or procedures if applicable; and documenting clinical information in the health record).

## 2020-01-01 NOTE — Patient Instructions (Addendum)
If you are age 57 or older, your body mass index should be between 23-30. Your Body mass index is 29.97 kg/m. If this is out of the aforementioned range listed, please consider follow up with your Primary Care Provider.  If you are age 56 or younger, your body mass index should be between 19-25. Your Body mass index is 29.97 kg/m. If this is out of the aformentioned range listed, please consider follow up with your Primary Care Provider.   Please keep a food diary with all the foods that you are eating for the next 6 weeks and bring to next appointment.  Please take Nexium 20mg  over-the-counter shortly before breakfast meal.  Please take Pepcid 20mg  every night as needed.  You will follow up with our office on 02-26-20 at 2:10pm.  Please arrive 10 minutes prior to appointment.  Thank you for entrusting me with your care and choosing [facility].  Dr Ardis Hughs

## 2020-02-26 ENCOUNTER — Ambulatory Visit: Payer: 59 | Admitting: Gastroenterology

## 2020-09-02 ENCOUNTER — Other Ambulatory Visit: Payer: Self-pay

## 2020-09-02 ENCOUNTER — Encounter: Payer: Self-pay | Admitting: Internal Medicine

## 2020-09-02 ENCOUNTER — Ambulatory Visit (INDEPENDENT_AMBULATORY_CARE_PROVIDER_SITE_OTHER): Payer: 59 | Admitting: Internal Medicine

## 2020-09-02 VITALS — BP 124/82 | HR 70 | Temp 96.7°F | Ht 68.5 in | Wt 201.0 lb

## 2020-09-02 DIAGNOSIS — Z0001 Encounter for general adult medical examination with abnormal findings: Secondary | ICD-10-CM

## 2020-09-02 DIAGNOSIS — Z809 Family history of malignant neoplasm, unspecified: Secondary | ICD-10-CM | POA: Diagnosis not present

## 2020-09-02 DIAGNOSIS — R7303 Prediabetes: Secondary | ICD-10-CM

## 2020-09-02 DIAGNOSIS — G8929 Other chronic pain: Secondary | ICD-10-CM

## 2020-09-02 DIAGNOSIS — F5101 Primary insomnia: Secondary | ICD-10-CM

## 2020-09-02 DIAGNOSIS — E7800 Pure hypercholesterolemia, unspecified: Secondary | ICD-10-CM | POA: Diagnosis not present

## 2020-09-02 DIAGNOSIS — K219 Gastro-esophageal reflux disease without esophagitis: Secondary | ICD-10-CM | POA: Diagnosis not present

## 2020-09-02 DIAGNOSIS — F419 Anxiety disorder, unspecified: Secondary | ICD-10-CM | POA: Diagnosis not present

## 2020-09-02 DIAGNOSIS — M545 Low back pain, unspecified: Secondary | ICD-10-CM

## 2020-09-02 MED ORDER — ESZOPICLONE 2 MG PO TABS: 2.0000 mg | ORAL_TABLET | Freq: Every evening | ORAL | 0 refills | Status: DC | PRN

## 2020-09-02 NOTE — Progress Notes (Addendum)
Subjective:    Patient ID: Katie Townsend, female    DOB: Mar 23, 1961, 59 y.o.   MRN: [id number]  HPI  Pt presents to the clinic today for her annual exam. She is also due to follow up chronic conditions.  Anxiety: Stable off meds. She is not currently seeing a therapist.  GERD: She rarely has breakthrough on Esomeprazole but when she does Famotidine helps.  There is no upper GI on file.  Insomnia: She has trouble falling asleep. She takes Benadryl with minimal relief.  She would like to consider alternative medication therapy at this time.  There is no sleep study on file.  OA: Mainly in her lower back. She takies Ibuprofen as needed with good relief of symptoms.   " I've had 4 siblings to be diagnosed with cancer. One sister had throat cancer. They treated the cancer but she died several years later from liver failure. The brother that just passed away had several health issues. Under incidental findings, they found cancer in his left breast and under his arm. I have one brother that was treated with radiation for a lung tumor. I have a sister that was diagnosed with stage 4 lung cancer. She is currently under treatment."   Flu: 05/2019 Tetanus: 04/2019 Covid: Pfizer Pap Smear: hysterectomy Mammogram: 11/2017 Bone Density: never Colon Screening: 2012 Vision Screening: as needed Dentist: biannually  Diet: She does eat meat. She consumes some fruits and veggies. She does eat some fried foods. She drinks mostly water and tea. Exercise: Walking  Review of Systems      Past Medical History:  Diagnosis Date  . Arthritis   . Depression   . GERD (gastroesophageal reflux disease)     Current Outpatient Medications  Medication Sig Dispense Refill  . diphenhydrAMINE (BENADRYL) 25 MG tablet Take 25 mg by mouth at bedtime as needed.    Marland Kitchen esomeprazole (NEXIUM) 20 MG capsule Take 1 capsule shortly before breakfast meal.    . famotidine (PEPCID) 20 MG tablet Take 1 tablet daily at  bedtime as needed.    . Vitamin D, Ergocalciferol, (DRISDOL) 1.25 MG (50000 UT) CAPS capsule Take 1 capsule (50,000 Units total) by mouth every 7 (seven) days. 12 capsule 0   No current facility-administered medications for this visit.    Allergies  Allergen Reactions  . Codeine Nausea Only    Family History  Problem Relation Age of Onset  . Arthritis Mother   . Hyperlipidemia Mother   . Heart disease Mother   . Hypertension Mother   . Anxiety disorder Mother   . Diabetes Mother   . Alcohol abuse Father   . Arthritis Father   . Mental illness Father   . Diabetes Sister   . Diabetes Brother   . Diabetes Maternal Aunt     Social History   Socioeconomic History  . Marital status: Married    Spouse name: Not on file  . Number of children: Not on file  . Years of education: Not on file  . Highest education level: Not on file  Occupational History  . Not on file  Tobacco Use  . Smoking status: Never Smoker  . Smokeless tobacco: Never Used  Substance and Sexual Activity  . Alcohol use: No    Alcohol/week: 0.0 standard drinks  . Drug use: No  . Sexual activity: Yes    Birth control/protection: Condom, Surgical  Other Topics Concern  . Not on file  Social History Narrative  . Not on file  Social Determinants of Health   Financial Resource Strain: Not on file  Food Insecurity: Not on file  Transportation Needs: Not on file  Physical Activity: Not on file  Stress: Not on file  Social Connections: Not on file  Intimate Partner Violence: Not on file     Constitutional: Denies fever, malaise, fatigue, headache or abrupt weight changes.  HEENT: Denies eye pain, eye redness, ear pain, ringing in the ears, wax buildup, runny nose, nasal congestion, bloody nose, or sore throat. Respiratory: Denies difficulty breathing, shortness of breath, cough or sputum production.   Cardiovascular: Denies chest pain, chest tightness, palpitations or swelling in the hands or feet.   Gastrointestinal: Denies abdominal pain, bloating, constipation, diarrhea or blood in the stool.  GU: Denies urgency, frequency, pain with urination, burning sensation, blood in urine, odor or discharge. Musculoskeletal: Patient reports intermittent joint pain.  Denies decrease in range of motion, difficulty with gait, muscle pain or joint swelling.  Skin: Denies redness, rashes, lesions or ulcercations.  Neurological: Patient reports insomnia.  Denies dizziness, difficulty with memory, difficulty with speech or problems with balance and coordination.  Psych: Patient has a history of anxiety.  Denies depression, SI/HI.  No other specific complaints in a complete review of systems (except as listed in HPI above).  Objective:   Physical Exam BP 124/82   Pulse 70   Temp (!) 96.7 F (35.9 C) (Temporal)   Ht 5' 8.5" (1.74 m)   Wt 201 lb (91.2 kg)   SpO2 98%   BMI 30.12 kg/m   Wt Readings from Last 3 Encounters:  01/01/20 200 lb (90.7 kg)  07/14/18 200 lb (90.7 kg)  03/24/18 201 lb (91.2 kg)    General: Appears her stated age, obese, in NAD. Skin: Warm, dry and intact. No rashes noted. HEENT: Head: normal shape and size; Eyes: sclera white, no icterus, conjunctiva pink, PERRLA and EOMs intact;  Neck:  Neck supple, trachea midline. No masses, lumps or thyromegaly present.  Cardiovascular: Normal rate and rhythm. S1,S2 noted.  No murmur, rubs or gallops noted. No JVD or BLE edema. No carotid bruits noted. Pulmonary/Chest: Normal effort and positive vesicular breath sounds. No respiratory distress. No wheezes, rales or ronchi noted.  Abdomen: Soft and nontender. Normal bowel sounds. No distention or masses noted. Liver, spleen and kidneys non palpable. Musculoskeletal: Strength 5/5 BUE/BLE no difficulty with gait.  Neurological: Alert and oriented. Cranial nerves II-XII grossly intact. Coordination normal.  Psychiatric: Mood and affect normal. Behavior is normal. Judgment and thought  content normal.     BMET    Component Value Date/Time   NA 145 07/14/2018 1550   K 4.9 07/14/2018 1550   CL 106 07/14/2018 1550   CO2 30 07/14/2018 1550   GLUCOSE 95 07/14/2018 1550   BUN 12 07/14/2018 1550   CREATININE 0.83 07/14/2018 1550   CALCIUM 10.2 07/14/2018 1550   GFRNONAA >60 02/16/2015 0245   GFRAA >60 02/16/2015 0245    Lipid Panel     Component Value Date/Time   CHOL 224 (H) 07/14/2018 1550   TRIG 143 07/14/2018 1550   HDL 44 (L) 07/14/2018 1550   CHOLHDL 5.1 (H) 07/14/2018 1550   VLDL 36.4 06/03/2017 1509   LDLCALC 153 (H) 07/14/2018 1550    CBC    Component Value Date/Time   WBC 7.7 07/14/2018 1550   RBC 3.98 07/14/2018 1550   HGB 12.6 07/14/2018 1550   HCT 36.6 07/14/2018 1550   PLT 272 07/14/2018 1550  MCV 92.0 07/14/2018 1550   MCH 31.7 07/14/2018 1550   MCHC 34.4 07/14/2018 1550   RDW 12.1 07/14/2018 1550    Hgb A1C Lab Results  Component Value Date   HGBA1C 5.4 07/14/2018          Assessment & Plan:   Preventative Health Maintenance::  Flu shot today Tetanus UTD Covid UTD She no longer needs Pap smears She will call to schedule her mammogram She declines bone density at this time Colon screening UTD Encouraged her to consume a balanced diet exercise regimen Advised her to see an eye doctor and dentist annually We will check CBC, C met, lipid, A1c and vitamin D today  RTC in 1 year, sooner if needed  Webb Silversmith, NP This visit occurred during the SARS-CoV-2 public health emergency.  Safety protocols were in place, including screening questions prior to the visit, additional usage of staff PPE, and extensive cleaning of exam room while observing appropriate contact time as indicated for disinfecting solutions.

## 2020-09-03 ENCOUNTER — Encounter: Payer: Self-pay | Admitting: Internal Medicine

## 2020-09-03 LAB — COMPREHENSIVE METABOLIC PANEL
ALT: 27 U/L (ref 0–35)
AST: 21 U/L (ref 0–37)
Albumin: 4.6 g/dL (ref 3.5–5.2)
Alkaline Phosphatase: 63 U/L (ref 39–117)
BUN: 13 mg/dL (ref 6–23)
CO2: 29 mEq/L (ref 19–32)
Calcium: 9.7 mg/dL (ref 8.4–10.5)
Chloride: 103 mEq/L (ref 96–112)
Creatinine, Ser: 0.74 mg/dL (ref 0.40–1.20)
GFR: 89.65 mL/min (ref >60.00)
Glucose, Bld: 98 mg/dL (ref 70–99)
Potassium: 4.1 mEq/L (ref 3.5–5.1)
Sodium: 140 mEq/L (ref 135–145)
Total Bilirubin: 0.8 mg/dL (ref 0.2–1.2)
Total Protein: 7.2 g/dL (ref 6.0–8.3)

## 2020-09-03 LAB — CBC
HCT: 38.2 % (ref 36.0–46.0)
Hemoglobin: 12.9 g/dL (ref 12.0–15.0)
MCHC: 33.8 g/dL (ref 30.0–36.0)
MCV: 93 fl (ref 78.0–100.0)
Platelets: 271 10*3/uL (ref 150.0–400.0)
RBC: 4.11 Mil/uL (ref 3.87–5.11)
RDW: 13.1 % (ref 11.5–15.5)
WBC: 8.5 10*3/uL (ref 4.0–10.5)

## 2020-09-03 LAB — LIPID PANEL
Cholesterol: 215 mg/dL — ABNORMAL HIGH (ref 0–200)
HDL: 47.9 mg/dL (ref >39.00)
LDL Cholesterol: 142 mg/dL — ABNORMAL HIGH (ref 0–99)
NonHDL: 166.66
Total CHOL/HDL Ratio: 4
Triglycerides: 125 mg/dL (ref 0.0–149.0)
VLDL: 25 mg/dL (ref 0.0–40.0)

## 2020-09-03 LAB — HEMOGLOBIN A1C: Hgb A1c MFr Bld: 5.8 % (ref 4.6–6.5)

## 2020-09-03 LAB — VITAMIN D 25 HYDROXY (VIT D DEFICIENCY, FRACTURES): VITD: 54.48 ng/mL (ref 30.00–100.00)

## 2020-09-03 NOTE — Assessment & Plan Note (Signed)
Will try Lunesta, Rx sent to pharmacy

## 2020-09-03 NOTE — Addendum Note (Signed)
Addended by: Lorre Munroe on: 09/03/2020 12:25 PM   Modules accepted: Orders

## 2020-09-03 NOTE — Patient Instructions (Signed)

## 2020-09-03 NOTE — Assessment & Plan Note (Signed)
Stable off meds We will monitor 

## 2020-09-03 NOTE — Assessment & Plan Note (Signed)
Stable on as omeprazole and famotidine CBC and C met today We will monitor

## 2020-09-03 NOTE — Assessment & Plan Note (Signed)
Continue ibuprofen as needed Encouraged daily stretching

## 2020-09-04 ENCOUNTER — Encounter: Payer: Self-pay | Admitting: Internal Medicine

## 2020-09-07 ENCOUNTER — Emergency Department
Admission: EM | Admit: 2020-09-07 | Discharge: 2020-09-07 | Disposition: A | Discharge status: Home / Self Care | Payer: 59 | Attending: Emergency Medicine | Admitting: Emergency Medicine

## 2020-09-07 ENCOUNTER — Encounter: Payer: Self-pay | Admitting: Emergency Medicine

## 2020-09-07 ENCOUNTER — Emergency Department: Payer: 59

## 2020-09-07 ENCOUNTER — Other Ambulatory Visit: Payer: Self-pay

## 2020-09-07 DIAGNOSIS — R509 Fever, unspecified: Secondary | ICD-10-CM | POA: Diagnosis present

## 2020-09-07 DIAGNOSIS — Z20822 Contact with and (suspected) exposure to covid-19: Secondary | ICD-10-CM | POA: Diagnosis not present

## 2020-09-07 DIAGNOSIS — J181 Lobar pneumonia, unspecified organism: Secondary | ICD-10-CM | POA: Diagnosis not present

## 2020-09-07 DIAGNOSIS — J189 Pneumonia, unspecified organism: Secondary | ICD-10-CM

## 2020-09-07 LAB — RESP PANEL BY RT-PCR (FLU A&B, COVID) ARPGX2
Influenza A by PCR: NEGATIVE
Influenza B by PCR: NEGATIVE
SARS Coronavirus 2 by RT PCR: NEGATIVE

## 2020-09-07 MED ORDER — AMOXICILLIN 500 MG PO CAPS: 1000.0000 mg | ORAL_CAPSULE | Freq: Three times a day (TID) | ORAL | 0 refills | Status: DC

## 2020-09-07 MED ORDER — BENZONATATE 100 MG PO CAPS: 100.0000 mg | ORAL_CAPSULE | Freq: Three times a day (TID) | ORAL | 0 refills | Status: DC | PRN

## 2020-09-07 MED ORDER — CEFTRIAXONE SODIUM 1 G IJ SOLR
1.0000 g | Freq: Once | INTRAMUSCULAR | Status: AC
  Administered 2020-09-07: 1 g via INTRAMUSCULAR
  Filled 2020-09-07: qty 10

## 2020-09-07 MED ORDER — ALBUTEROL SULFATE HFA 108 (90 BASE) MCG/ACT IN AERS: 2.0000 | INHALATION_SPRAY | Freq: Four times a day (QID) | RESPIRATORY_TRACT | 0 refills | Status: DC | PRN

## 2020-09-07 MED ORDER — AZITHROMYCIN 250 MG PO TABS: ORAL_TABLET | ORAL | 0 refills | Status: DC

## 2020-09-07 MED ORDER — AZITHROMYCIN 500 MG PO TABS
500.0000 mg | ORAL_TABLET | Freq: Once | ORAL | Status: AC
  Administered 2020-09-07: 500 mg via ORAL
  Filled 2020-09-07: qty 1

## 2020-09-07 MED ORDER — IPRATROPIUM-ALBUTEROL 0.5-2.5 (3) MG/3ML IN SOLN
3.0000 mL | Freq: Once | RESPIRATORY_TRACT | Status: AC
  Administered 2020-09-07: 3 mL via RESPIRATORY_TRACT
  Filled 2020-09-07: qty 3

## 2020-09-07 MED ORDER — GUAIFENESIN-DM 100-10 MG/5ML PO SYRP: 5.0000 mL | ORAL_SOLUTION | ORAL | 0 refills | Status: DC | PRN

## 2020-09-07 NOTE — ED Provider Notes (Signed)
[facility] Emergency Department Provider Note  ____________________________________________  Time seen: Approximately 10:35 AM  I have reviewed the triage vital signs and the nursing notes.   HISTORY  Chief Complaint Cough and Fever    HPI Katie Townsend is a 60 y.o. female that presents to emergency department for evaluation of low-grade fever and nonproductive cough for 3 days.  Patient states that this morning she could hear some wheezing so she came to the emergency department for evaluation.  Patient received the full Pfizer vaccine series in September.  No sick contacts.  No shortness of breath, chest pain, vomiting, abdominal pain, diarrhea.   Past Medical History:  Diagnosis Date  . Arthritis   . Depression   . GERD (gastroesophageal reflux disease)     Patient Active Problem List   Diagnosis Date Noted  . Chronic midline low back pain without sciatica 07/14/2018  . Insomnia 02/18/2018  . GERD (gastroesophageal reflux disease) 08/21/2014  . Anxiety 08/21/2014    Past Surgical History:  Procedure Laterality Date  . ABDOMINAL HYSTERECTOMY  2009  . APPENDECTOMY  1978  . EYE SURGERY Right 1976    Prior to Admission medications   Medication Sig Start Date End Date Taking? Authorizing Provider  amoxicillin (AMOXIL) 500 MG capsule Take 2 capsules (1,000 mg total) by mouth 3 (three) times daily for 10 days. 09/07/20 09/17/20 Yes Enid Derry, PA-C  azithromycin (ZITHROMAX Z-PAK) 250 MG tablet Take 2 tablets (500 mg) on  Day 1,  followed by 1 tablet (250 mg) once daily on Days 2 through 5. 09/08/20  Yes Enid Derry, PA-C  benzonatate (TESSALON PERLES) 100 MG capsule Take 1 capsule (100 mg total) by mouth 3 (three) times daily as needed. 09/07/20 09/07/21 Yes Enid Derry, PA-C  guaiFENesin-dextromethorphan (ROBITUSSIN DM) 100-10 MG/5ML syrup Take 5 mLs by mouth every 4 (four) hours as needed for cough. 09/07/20  Yes Enid Derry, PA-C  albuterol  (VENTOLIN HFA) 108 (90 Base) MCG/ACT inhaler Inhale 2 puffs into the lungs every 6 (six) hours as needed for wheezing or shortness of breath. 09/07/20   Enid Derry, PA-C  diphenhydrAMINE (BENADRYL) 25 MG tablet Take 25 mg by mouth at bedtime as needed.    [provider]  esomeprazole (NEXIUM) 20 MG capsule Take 1 capsule shortly before breakfast meal. 01/01/20   Rachael Fee, MD  eszopiclone (LUNESTA) 2 MG TABS tablet Take 1 tablet (2 mg total) by mouth at bedtime as needed for sleep. Take immediately before bedtime 09/02/20   Lorre Munroe, NP  famotidine (PEPCID) 20 MG tablet Take 1 tablet daily at bedtime as needed. 01/01/20   Rachael Fee, MD    Allergies Codeine  Family History  Problem Relation Age of Onset  . Arthritis Mother   . Hyperlipidemia Mother   . Heart disease Mother   . Hypertension Mother   . Anxiety disorder Mother   . Diabetes Mother   . Alcohol abuse Father   . Arthritis Father   . Mental illness Father   . Diabetes Sister   . Diabetes Brother   . Diabetes Maternal Aunt     Social History Social History   Tobacco Use  . Smoking status: Never Smoker  . Smokeless tobacco: Never Used  Substance Use Topics  . Alcohol use: No    Alcohol/week: 0.0 standard drinks  . Drug use: No     Review of Systems  Constitutional: Positive for low grade fever. Eyes: No visual changes. No  discharge. ENT: Negative for congestion and rhinorrhea. Cardiovascular: No chest pain. Respiratory: Positive for cough. No SOB. Gastrointestinal: No abdominal pain.  No nausea, no vomiting.  No diarrhea.  No constipation. Musculoskeletal: Negative for musculoskeletal pain. Skin: Negative for rash, abrasions, lacerations, ecchymosis. Neurological: Negative for headaches.   ____________________________________________   PHYSICAL EXAM:  VITAL SIGNS: ED Triage Vitals  Enc Vitals Group     BP 09/07/20 0726 (!) 145/85     Pulse Rate 09/07/20 0726 82     Resp  09/07/20 0726 16     Temp 09/07/20 0726 98.9 F (37.2 C)     Temp Source 09/07/20 0726 Oral     SpO2 09/07/20 0726 97 %     Weight 09/07/20 0727 201 lb (91.2 kg)     Height 09/07/20 0727 5\' 8"  (1.727 m)     Head Circumference --      Peak Flow --      Pain Score 09/07/20 0726 0     Pain Loc --      Pain Edu? --      Excl. in [place]? --      Constitutional: Alert and oriented. Well appearing and in no acute distress. Eyes: Conjunctivae are normal. PERRL. EOMI. No discharge. Head: Atraumatic. ENT: No frontal and maxillary sinus tenderness.      Ears: Tympanic membranes pearly gray with good landmarks. No discharge.      Nose: No congestion/rhinnorhea.      Mouth/Throat: Mucous membranes are moist. Oropharynx non-erythematous. Tonsils not enlarged. No exudates. Uvula midline. Neck: No stridor.   Hematological/Lymphatic/Immunilogical: No cervical lymphadenopathy. Cardiovascular: Normal rate, regular rhythm.  Good peripheral circulation. Respiratory: Normal respiratory effort without tachypnea or retractions. Scattered wheezes. Good air entry to the bases with no decreased or absent breath sounds. Gastrointestinal: Bowel sounds 4 quadrants. Soft and nontender to palpation. No guarding or rigidity. No palpable masses. No distention. Musculoskeletal: Full range of motion to all extremities. No gross deformities appreciated. Neurologic:  Normal speech and language. No gross focal neurologic deficits are appreciated.  Skin:  Skin is warm, dry and intact. No rash noted. Psychiatric: Mood and affect are normal. Speech and behavior are normal. Patient exhibits appropriate insight and judgement.   ____________________________________________   LABS (all labs ordered are listed, but only abnormal results are displayed)  Labs Reviewed  RESP PANEL BY RT-PCR (FLU A&B, COVID) [id number]    ____________________________________________  EKG   ____________________________________________  RADIOLOGY Robinette Haines, personally viewed and evaluated these images (plain radiographs) as part of my medical decision making, as well as reviewing the written report by the radiologist.  DG Chest 2 View  Result Date: 09/07/2020 CLINICAL DATA:  Cough, low-grade fever EXAM: CHEST - 2 VIEW COMPARISON:  09/03/2017 chest radiograph. FINDINGS: Stable cardiomediastinal silhouette with normal heart size. No pneumothorax. No pleural effusion. Faint patchy opacity in the peripheral right mid lung. IMPRESSION: Faint patchy opacity in the peripheral right mid lung, suspicious for pneumonia. Chest radiograph follow-up advised. Electronically Signed   By: Ilona Sorrel M.D.   On: 09/07/2020 08:31    ____________________________________________    PROCEDURES  Procedure(s) performed:    Procedures    Medications  cefTRIAXone (ROCEPHIN) injection 1 g (1 g Intramuscular Given 09/07/20 1059)  azithromycin (ZITHROMAX) tablet 500 mg (500 mg Oral Given 09/07/20 1058)  ipratropium-albuterol (DUONEB) 0.5-2.5 (3) MG/3ML nebulizer solution 3 mL (3 mLs Nebulization Given 09/07/20 1100)     ____________________________________________   INITIAL IMPRESSION / ASSESSMENT AND PLAN /  ED COURSE  Pertinent labs & imaging results that were available during my care of the patient were reviewed by me and considered in my medical decision making (see chart for details).  Review of the [place] CSRS was performed in accordance of the [place] prior to dispensing any controlled drugs.   Patient's diagnosis is consistent with community-acquired pneumonia. Vital signs and exam are reassuring.  Chest x-ray shows a possible right sided pneumonia.  Covid and influenza tests are negative.  Patient received IM Rocephin and azithromycin in the emergency department.  She received a DuoNeb treatment and wheezing cleared following  treatment.  Patient appears well and is staying well hydrated. Patient should alternate tylenol and ibuprofen for fever. Patient feels comfortable going home. Patient will be discharged home with prescriptions for amoxicillin, azithromycin, albuterol inhaler and cough medicine. Patient is to follow up with PCP as needed or otherwise directed. Patient is given ED precautions to return to the ED for any worsening or new symptoms.  Katie Townsend was evaluated in Emergency Department on 09/07/2020 for the symptoms described in the history of present illness. She was evaluated in the context of the global COVID-19 pandemic, which necessitated consideration that the patient might be at risk for infection with the SARS-CoV-2 virus that causes COVID-19. Institutional protocols and algorithms that pertain to the evaluation of patients at risk for COVID-19 are in a state of rapid change based on information released by regulatory bodies including the CDC and federal and state organizations. These policies and algorithms were followed during the patient's care in the ED.   ____________________________________________  FINAL CLINICAL IMPRESSION(S) / ED DIAGNOSES  Final diagnoses:  Community acquired pneumonia of right lung, unspecified part of lung      NEW MEDICATIONS STARTED DURING THIS VISIT:  ED Discharge Orders         Ordered    azithromycin (ZITHROMAX Z-PAK) 250 MG tablet        09/07/20 1103    amoxicillin (AMOXIL) 500 MG capsule  3 times daily        09/07/20 1103    albuterol (VENTOLIN HFA) 108 (90 Base) MCG/ACT inhaler  Every 6 hours PRN,   Status:  Discontinued        09/07/20 1132    benzonatate (TESSALON PERLES) 100 MG capsule  3 times daily PRN        09/07/20 1132    guaiFENesin-dextromethorphan (ROBITUSSIN DM) 100-10 MG/5ML syrup  Every 4 hours PRN        09/07/20 1132    albuterol (VENTOLIN HFA) 108 (90 Base) MCG/ACT inhaler  Every 6 hours PRN        09/07/20 1215               This chart was dictated using voice recognition software/Dragon. Despite best efforts to proofread, errors can occur which can change the meaning. Any change was purely unintentional.    Laban Emperor, PA-C 09/07/20 1419    Harvest Dark, MD 09/07/20 1520

## 2020-09-07 NOTE — ED Triage Notes (Signed)
Pt to ED via POV c/o cough, low grade fever, and "cold symptoms". Pt states that she feels like she can't take a good deep breath and that she is sore from coughing. Pt is in NAD.

## 2020-09-07 NOTE — Discharge Instructions (Addendum)
Please start your antibiotics tomorrow.  Return the emergency department for any worsening of symptoms.

## 2020-09-15 ENCOUNTER — Other Ambulatory Visit: Payer: Self-pay

## 2020-09-15 ENCOUNTER — Ambulatory Visit (INDEPENDENT_AMBULATORY_CARE_PROVIDER_SITE_OTHER): Payer: 59 | Admitting: Internal Medicine

## 2020-09-15 ENCOUNTER — Ambulatory Visit (INDEPENDENT_AMBULATORY_CARE_PROVIDER_SITE_OTHER)
Admission: RE | Admit: 2020-09-15 | Discharge: 2020-09-15 | Disposition: A | Discharge status: Home / Self Care | Payer: 59 | Source: Ambulatory Visit | Attending: Internal Medicine | Admitting: Internal Medicine

## 2020-09-15 ENCOUNTER — Encounter: Payer: Self-pay | Admitting: Internal Medicine

## 2020-09-15 ENCOUNTER — Other Ambulatory Visit: Payer: Self-pay | Admitting: Internal Medicine

## 2020-09-15 VITALS — BP 140/90 | HR 78 | Temp 97.2°F | Wt 200.0 lb

## 2020-09-15 DIAGNOSIS — J189 Pneumonia, unspecified organism: Secondary | ICD-10-CM | POA: Diagnosis not present

## 2020-09-15 DIAGNOSIS — R062 Wheezing: Secondary | ICD-10-CM

## 2020-09-15 MED ORDER — HYDROCOD POLST-CPM POLST ER 10-8 MG/5ML PO SUER: 5.0000 mL | Freq: Every evening | ORAL | 0 refills | Status: DC | PRN

## 2020-09-15 MED ORDER — PREDNISONE 10 MG PO TABS: ORAL_TABLET | ORAL | 0 refills | Status: DC

## 2020-09-15 NOTE — Progress Notes (Signed)
Subjective:    Patient ID: Katie Townsend, female    DOB: [birth year], 60 y.o.   MRN: [id number]  HPI  Patient presents to the clinic today for ER follow-up.  She went to the ER 09/07/2020 with complaint of 3-day history of fever, cough and wheezing.  ECG was unremarkable.  Chest x-ray was concerning for possible RML community-acquired pneumonia.  She was treated with a DuoNeb and 1 g of IM Rocephin.  Her flu and COVID test were negative.  She was discharged with a prescription for Azithromycin, Amoxicillin, Robitussin-DM, Tessalon and Albuterol.  She has been taking these medications as prescribed in addition to Mucinex twice daily and reports she is feeling much better.  Review of Systems      Past Medical History:  Diagnosis Date  . Arthritis   . Depression   . GERD (gastroesophageal reflux disease)     Current Outpatient Medications  Medication Sig Dispense Refill  . chlorpheniramine-HYDROcodone (TUSSIONEX PENNKINETIC ER) 10-8 MG/5ML SUER Take 5 mLs by mouth at bedtime as needed. 140 mL 0  . diphenhydrAMINE (BENADRYL) 25 MG tablet Take 25 mg by mouth at bedtime as needed.    Marland Kitchen esomeprazole (NEXIUM) 20 MG capsule Take 1 capsule shortly before breakfast meal.    . eszopiclone (LUNESTA) 2 MG TABS tablet Take 1 tablet (2 mg total) by mouth at bedtime as needed for sleep. Take immediately before bedtime 20 tablet 0  . famotidine (PEPCID) 20 MG tablet Take 1 tablet daily at bedtime as needed.    . predniSONE (DELTASONE) 10 MG tablet Take 6 tabs day 1, 5 tabs day 2, 4 tabs day 3, 3 tabs day 4, 2 tabs day 5, 1 tab day 6 21 tablet 0   No current facility-administered medications for this visit.    Allergies  Allergen Reactions  . Codeine Nausea Only    Family History  Problem Relation Age of Onset  . Arthritis Mother   . Hyperlipidemia Mother   . Heart disease Mother   . Hypertension Mother   . Anxiety disorder Mother   . Diabetes Mother   . Alcohol abuse Father   . Arthritis  Father   . Mental illness Father   . Diabetes Sister   . Diabetes Brother   . Diabetes Maternal Aunt     Social History   Socioeconomic History  . Marital status: Married    Spouse name: Not on file  . Number of children: Not on file  . Years of education: Not on file  . Highest education level: Not on file  Occupational History  . Not on file  Tobacco Use  . Smoking status: Never Smoker  . Smokeless tobacco: Never Used  Substance and Sexual Activity  . Alcohol use: No    Alcohol/week: 0.0 standard drinks  . Drug use: No  . Sexual activity: Yes    Birth control/protection: Condom, Surgical  Other Topics Concern  . Not on file  Social History Narrative  . Not on file   Social Determinants of Health   Financial Resource Strain: Not on file  Food Insecurity: Not on file  Transportation Needs: Not on file  Physical Activity: Not on file  Stress: Not on file  Social Connections: Not on file  Intimate Partner Violence: Not on file     Constitutional: Denies fever, malaise, fatigue, headache or abrupt weight changes.  HEENT: Denies eye pain, eye redness, ear pain, ringing in the ears, wax buildup, runny nose, nasal congestion,  bloody nose, or sore throat. Respiratory: Denies difficulty breathing, shortness of breath, cough or sputum production.   Cardiovascular: Denies chest pain, chest tightness, palpitations or swelling in the hands or feet.  Neurological: Denies dizziness, difficulty with memory, difficulty with speech or problems with balance and coordination.    No other specific complaints in a complete review of systems (except as listed in HPI above).  Objective:   Physical Exam    BP 140/90   Pulse 78   Temp (!) 97.2 F (36.2 C) (Temporal)   Wt 200 lb (90.7 kg)   SpO2 98%   BMI 30.41 kg/m  Wt Readings from Last 3 Encounters:  09/15/20 200 lb (90.7 kg)  09/07/20 201 lb (91.2 kg)  09/02/20 201 lb (91.2 kg)    General: Appears her stated age, well  developed, well nourished in NAD. Neck:  No adenopathy noted. Cardiovascular: Normal rate and rhythm. S1,S2 noted.  No murmur, rubs or gallops noted. No JVD or BLE edema. Pulmonary/Chest: Normal effort and positive vesicular breath sounds. No respiratory distress. Expiratory wheezing and crackles noted throughout all lung fields posteriorly.  Neurological: Alert and oriented.   BMET    Component Value Date/Time   NA 140 09/02/2020 1605   K 4.1 09/02/2020 1605   CL 103 09/02/2020 1605   CO2 29 09/02/2020 1605   GLUCOSE 98 09/02/2020 1605   BUN 13 09/02/2020 1605   CREATININE 0.74 09/02/2020 1605   CREATININE 0.83 07/14/2018 1550   CALCIUM 9.7 09/02/2020 1605   GFRNONAA >60 02/16/2015 0245   GFRAA >60 02/16/2015 0245    Lipid Panel     Component Value Date/Time   CHOL 215 (H) 09/02/2020 1605   TRIG 125.0 09/02/2020 1605   HDL 47.90 09/02/2020 1605   CHOLHDL 4 09/02/2020 1605   VLDL 25.0 09/02/2020 1605   LDLCALC 142 (H) 09/02/2020 1605   LDLCALC 153 (H) 07/14/2018 1550    CBC    Component Value Date/Time   WBC 8.5 09/02/2020 1605   RBC 4.11 09/02/2020 1605   HGB 12.9 09/02/2020 1605   HCT 38.2 09/02/2020 1605   PLT 271.0 09/02/2020 1605   MCV 93.0 09/02/2020 1605   MCH 31.7 07/14/2018 1550   MCHC 33.8 09/02/2020 1605   RDW 13.1 09/02/2020 1605    Hgb A1C Lab Results  Component Value Date   HGBA1C 5.8 09/02/2020          Assessment & Plan:   ER follow-up for Community-Acquired Pneumonia:  ER notes, labs and imaging reviewed Advised her to finish her course of amoxicillin Rx for Tussionex for nighttime cough-sedation caution given Rx for prednisone x6 days for persistent wheezing We will repeat chest x-ray today, consider CT chest pending x-ray results  We will follow-up after x-ray, return precautions discussed Webb Silversmith, NP This visit occurred during the SARS-CoV-2 public health emergency.  Safety protocols were in place, including screening  questions prior to the visit, additional usage of staff PPE, and extensive cleaning of exam room while observing appropriate contact time as indicated for disinfecting solutions.

## 2020-09-15 NOTE — Patient Instructions (Signed)
Community-Acquired Pneumonia, Adult Pneumonia is an infection of the lungs. It causes irritation and swelling in the airways of the lungs. Mucus and fluid may also build up inside the airways. This may cause coughing and trouble breathing. One type of pneumonia can happen while you are in a hospital. A different type can happen when you are not in a hospital (community-acquired pneumonia). What are the causes? This condition is caused by germs (viruses, bacteria, or fungi). Some types of germs can spread from person to person. Pneumonia is not thought to spread from person to person.   What increases the risk? You are more likely to develop this condition if:  You have a long-term (chronic) disease, such as: ? Disease of the lungs. This may be chronic obstructive pulmonary disease (COPD) or asthma. ? Heart failure. ? Cystic fibrosis. ? Diabetes. ? Kidney disease. ? Sickle cell disease. ? HIV.  You have other health problems, such as: ? Your body's defense system (immune system) is weak. ? A condition that may cause you to breathe in fluids from your mouth and nose.  You had your spleen taken out.  You do not take good care of your teeth and mouth (poor dental hygiene).  You use or have used tobacco products.  You travel where the germs that cause this illness are common.  You are near certain animals or the places they live.  You are older than 58 years of age. What are the signs or symptoms? Symptoms of this condition include:  A cough.  A fever.  Sweating or chills.  Chest pain, often when you breathe deeply or cough.  Breathing problems, such as: ? Fast breathing. ? Trouble breathing. ? Shortness of breath.  Feeling tired (fatigued).  Muscle aches. How is this treated? Treatment for this condition depends on many things, such as:  The cause of your illness.  Your medicines.  Your other health problems. Most adults can be treated at home. Sometimes,  treatment must happen in a hospital.  Treatment may include medicines to kill germs.  Medicines may depend on which germ caused your illness. Very bad pneumonia is rare. If you get it, you may:  Have a machine to help you breathe.  Have fluid taken away from around your lungs. Follow these instructions at home: Medicines  Take over-the-counter and prescription medicines only as told by your doctor.  Take cough medicine only if you are losing sleep. Cough medicine can keep your body from taking mucus away from your lungs.  If you were prescribed an antibiotic medicine, take it as told by your doctor. Do not stop taking the antibiotic even if you start to feel better. Lifestyle  Do not drink alcohol.  Do not use any products that contain nicotine or tobacco, such as cigarettes, e-cigarettes, and chewing tobacco. If you need help quitting, ask your doctor.  Eat a healthy diet. This includes a lot of vegetables, fruits, whole grains, low-fat dairy products, and low-fat (lean) protein.      General instructions  Rest a lot. Sleep for at least 8 hours each night.  Sleep with your head and neck raised. Put a few pillows under your head or sleep in a reclining chair.  Return to your normal activities as told by your doctor. Ask your doctor what activities are safe for you.  Drink enough fluid to keep your pee (urine) pale yellow.  If your throat is sore, rinse your mouth often with salt water. To make salt   water, dissolve -1 tsp (3-6 g) of salt in 1 cup (237 mL) of warm water.  Keep all follow-up visits as told by your doctor. This is important.   How is this prevented? You can lower your risk of pneumonia by:  Getting the pneumonia shot (vaccine). These shots have different types and schedules. Ask your doctor what works best for you. Think about getting this shot if: ? You are older than 58 years of age. ? You are 19-65 years of age and:  You are being treated for  cancer.  You have long-term lung disease.  You have other problems that affect your body's defense system. Ask your doctor if you have one of these.  Getting your flu shot every year. Ask your doctor which type of shot is best for you.  Going to the dentist as often as told.  Washing your hands often with soap and water for at least 20 seconds. If you cannot use soap and water, use hand sanitizer. Contact a doctor if:  You have a fever.  You lose sleep because your cough medicine does not help. Get help right away if:  You are short of breath and this gets worse.  You have more chest pain.  Your sickness gets worse. This is very serious if: ? You are an older adult. ? Your body's defense system is weak.  You cough up blood. These symptoms may be an emergency. Do not wait to see if the symptoms will go away. Get medical help right away. Call your local emergency services (911 in the [place] Do not drive yourself to the hospital. Summary  Pneumonia is an infection of the lungs.  Community-acquired pneumonia affects people who have not been in the hospital. Certain germs can cause this infection.  This condition may be treated with medicines that kill germs.  For very bad pneumonia, you may need a hospital stay and treatment to help with breathing. This information is not intended to replace advice given to you by your health care provider. Make sure you discuss any questions you have with your health care provider. Document Revised: 06/05/2019 Document Reviewed: 06/05/2019 Elsevier Patient Education  2021 [employer].  

## 2020-09-17 NOTE — Telephone Encounter (Signed)
Tussionex out of stock, this is what the pharmacy sent to replace

## 2020-12-23 ENCOUNTER — Encounter: Payer: Self-pay | Admitting: Gastroenterology

## 2021-03-17 ENCOUNTER — Encounter: Payer: 59 | Admitting: Gastroenterology

## 2021-05-07 ENCOUNTER — Encounter: Payer: Self-pay | Admitting: Emergency Medicine

## 2021-05-07 ENCOUNTER — Other Ambulatory Visit: Payer: Self-pay

## 2021-05-07 ENCOUNTER — Telehealth: Payer: Self-pay

## 2021-05-07 ENCOUNTER — Emergency Department (HOSPITAL_COMMUNITY)
Admission: EM | Admit: 2021-05-07 | Discharge: 2021-05-07 | Disposition: A | Discharge status: Home / Self Care | Payer: 59 | Attending: Emergency Medicine | Admitting: Emergency Medicine

## 2021-05-07 ENCOUNTER — Emergency Department (HOSPITAL_COMMUNITY): Payer: 59

## 2021-05-07 ENCOUNTER — Ambulatory Visit
Admission: EM | Admit: 2021-05-07 | Discharge: 2021-05-07 | Disposition: A | Discharge status: Home / Self Care | Payer: 59 | Attending: Internal Medicine | Admitting: Internal Medicine

## 2021-05-07 DIAGNOSIS — R079 Chest pain, unspecified: Secondary | ICD-10-CM | POA: Diagnosis not present

## 2021-05-07 DIAGNOSIS — R0789 Other chest pain: Secondary | ICD-10-CM

## 2021-05-07 LAB — TROPONIN I (HIGH SENSITIVITY)
Troponin I (High Sensitivity): 5 ng/L (ref <18)
Troponin I (High Sensitivity): 4 ng/L (ref <18)

## 2021-05-07 LAB — CBC WITH DIFFERENTIAL/PLATELET
Abs Immature Granulocytes: 0.04 10*3/uL (ref 0.00–0.07)
Basophils Absolute: 0 10*3/uL (ref 0.0–0.1)
Basophils Relative: 0 %
Eosinophils Absolute: 0.1 10*3/uL (ref 0.0–0.5)
Eosinophils Relative: 1 %
HCT: 39 % (ref 36.0–46.0)
Hemoglobin: 13.1 g/dL (ref 12.0–15.0)
Immature Granulocytes: 1 %
Lymphocytes Relative: 25 %
Lymphs Abs: 2.2 10*3/uL (ref 0.7–4.0)
MCH: 32 pg (ref 26.0–34.0)
MCHC: 33.6 g/dL (ref 30.0–36.0)
MCV: 95.1 fL (ref 80.0–100.0)
Monocytes Absolute: 0.7 10*3/uL (ref 0.1–1.0)
Monocytes Relative: 8 %
Neutro Abs: 5.8 10*3/uL (ref 1.7–7.7)
Neutrophils Relative %: 65 %
Platelets: 265 10*3/uL (ref 150–400)
RBC: 4.1 MIL/uL (ref 3.87–5.11)
RDW: 12.2 % (ref 11.5–15.5)
WBC: 8.8 10*3/uL (ref 4.0–10.5)
nRBC: 0 % (ref 0.0–0.2)

## 2021-05-07 LAB — BASIC METABOLIC PANEL
Anion gap: 8 (ref 5–15)
BUN: 11 mg/dL (ref 6–20)
CO2: 29 mmol/L (ref 22–32)
Calcium: 9.6 mg/dL (ref 8.9–10.3)
Chloride: 102 mmol/L (ref 98–111)
Creatinine, Ser: 0.88 mg/dL (ref 0.44–1.00)
GFR, Estimated: >60 mL/min (ref >60)
Glucose, Bld: 105 mg/dL — ABNORMAL HIGH (ref 70–99)
Potassium: 3.6 mmol/L (ref 3.5–5.1)
Sodium: 139 mmol/L (ref 135–145)

## 2021-05-07 NOTE — Discharge Instructions (Addendum)
Please go to the hospital as soon as you leave urgent care.

## 2021-05-07 NOTE — Telephone Encounter (Signed)
[facility] - Client TELEPHONE ADVICE RECORD [facility] Patient Name: Katie Townsend Gender: Female DOB: 12/20/1960 Age: 60 Y 75 M 6 D Return Phone Number: [phone] (Primary) Address: City/ State/ Zip: [place]  [address] Client [facility] - Client Client Site [facility] - Day Physician [facility] - PHYSICIAN, NOT LISTED- MD Contact Type Call Who Is Calling Patient / Member / Family / Caregiver Call Type Triage / Clinical Relationship To Patient Self Return Phone Number [phone] (Primary) Chief Complaint CHEST PAIN - pain, pressure, heaviness or tightness Reason for Call Symptomatic / Request for [place] states she is having pain on the left side of her pain. Caller states it's on top of left breast area. (Caller was transferred from office due to no appts and doctor is no longer at the office. Dr Juluis Rainier) Translation No Nurse Assessment Nurse: Vallery Sa, RN, Tye [place] Date/Time Eilene Ghazi Time): 05/07/2021 1:12:56 PM Confirm and document reason for call. If symptomatic, describe symptoms. ---Lanee states that she developed left sided chest pain about 2 days ago that is worse again today (current pain rated as a 4 on the 1 to 10 scale). No severe breathing difficulty or blueness around her lips. No injury in the past 3 days. Alert and responsive. Does the patient have any new or worsening symptoms? ---Yes Will a triage be completed? ---Yes Related visit to physician within the last 2 weeks? ---No Does the PT have any chronic conditions? (i.e. diabetes, asthma, this includes High risk factors for pregnancy, etc.) ---Yes List chronic conditions. ---Acid Reflux Is this a behavioral health or substance abuse call? ---No Guidelines Guideline Title Affirmed Question Affirmed Notes Nurse Date/Time (Eastern Time) Chest Pain Pain also in shoulder(s) or arm(s) or  jaw (Exception: pain is clearly made worse by movement) Trumbull, RN, Tye [place] 05/07/2021 1:15:22 PM PLEASE NOTE: All timestamps contained within this report are represented as [place] Standard Time. CONFIDENTIALTY NOTICE: This fax transmission is intended only for the addressee. It contains information that is legally privileged, confidential or otherwise protected from use or disclosure. If you are not the intended recipient, you are strictly prohibited from reviewing, disclosing, copying using or disseminating any of this information or taking any action in reliance on or regarding this information. If you have received this fax in error, please notify us immediately by telephone so that we can arrange for its return to [place]. Phone: [phone], Toll-Free: [phone], Fax: [phone] Page: 2 of 2 Call Id: [id number] [place]. Time Eilene Ghazi Time) Disposition Final User 05/07/2021 1:11:37 PM Send to Urgent Queue Merwyn Katos 05/07/2021 1:17:53 PM Go to ED Now Yes Vallery Sa, RN, Rosey Bath Disagree/Comply Comply Caller Understands Yes PreDisposition Call Doctor Care Advice Given Per Guideline GO TO ED NOW: * You need to be seen in the Emergency Department. * Go to the ED at ___________ [place] now. Drive carefully. NOTE TO TRIAGER - DRIVING: * Another adult should drive. * Patient should not delay going to the emergency department. * If immediate transportation is not available via car, rideshare (e.g., Lyft, Uber), or taxi, then the patient should be instructed to call EMS-911. BRING MEDICINES: * Bring a list of your current medicines when you go to the Emergency Department (ER). * Bring the pill bottles too. This will help the doctor (or NP/PA) to make certain you are taking the right medicines and the right dose. CALL EMS IF: * Severe difficulty breathing occurs * Passes  out or becomes too weak to stand * You become worse CARE ADVICE given per Chest Pain (Adult)  guideline. Referrals GO TO FACILITY UNDECIDED

## 2021-05-07 NOTE — ED Provider Notes (Signed)
Emergency Medicine Provider Triage Evaluation Note  Katie Townsend , a 60 y.o. female  was evaluated in triage.  Pt complains of left-sided chest pain that radiates to left shoulder x4 to 5 days.  Chest pain is intermittent in nature. No relation to exertion. Denies associated shortness of breath, nausea, and vomiting.  No tobacco use.  No cardiac history.  No history of blood clots.  Patient evaluated at urgent care prior to arrival and sent to the ED for further evaluation.  Review of Systems  Positive: CP Negative: SOB  Physical Exam  BP 136/87   Pulse 81   Temp 99 F (37.2 C) (Oral)   Resp 14   SpO2 97%  Gen:   Awake, no distress   Resp:  Normal effort  MSK:   Moves extremities without difficulty  Other:    Medical Decision Making  Medically screening exam initiated at 3:57 PM.  Appropriate orders placed.  Katie Townsend was informed that the remainder of the evaluation will be completed by another provider, this initial triage assessment does not replace that evaluation, and the importance of remaining in the ED until their evaluation is complete.  Cardiac labs   Katie Townsend 05/07/21 1558    Carmin Muskrat, MD 05/07/21 [phone]

## 2021-05-07 NOTE — Telephone Encounter (Signed)
I spoke with pt and she is at [facility] at [facility] to get evaluated and have an EKG. Pt is going to TOC at [facility] and is not going to follow R BaityNP.

## 2021-05-07 NOTE — ED Provider Notes (Signed)
[facility] EMERGENCY DEPARTMENT Provider Note   CSN: [id number] Arrival date & time: 05/07/21  1537     History Chief Complaint  Patient presents with   Chest Pain    Katie Townsend is a 60 y.o. female.  HPI  61 year old female past medical history of GERD presents the emergency department left-sided chest pain.  Patient states this is been going on for the past 4 days.  It is left-sided, sharp, brief and self resolves.  Currently she has no active chest pain.  Denies any associated shortness of breath, cough, back pain.  No swelling of her lower extremities.  No recent illness or fever.  No known cardiac disease.  Past Medical History:  Diagnosis Date   Arthritis    Depression    GERD (gastroesophageal reflux disease)     Patient Active Problem List   Diagnosis Date Noted   Chronic midline low back pain without sciatica 07/14/2018   Insomnia 02/18/2018   GERD (gastroesophageal reflux disease) 08/21/2014   Anxiety 08/21/2014    Past Surgical History:  Procedure Laterality Date   ABDOMINAL HYSTERECTOMY  2009   APPENDECTOMY  1978   EYE SURGERY Right 1976     OB History   No obstetric history on file.     Family History  Problem Relation Age of Onset   Arthritis Mother    Hyperlipidemia Mother    Heart disease Mother    Hypertension Mother    Anxiety disorder Mother    Diabetes Mother    Alcohol abuse Father    Arthritis Father    Mental illness Father    Diabetes Sister    Diabetes Brother    Diabetes Maternal Aunt     Social History   Tobacco Use   Smoking status: Never   Smokeless tobacco: Never  Substance Use Topics   Alcohol use: No    Alcohol/week: 0.0 standard drinks   Drug use: No    Home Medications Prior to Admission medications   Medication Sig Start Date End Date Taking? Authorizing Provider  diphenhydrAMINE (BENADRYL) 25 MG tablet Take 25 mg by mouth at bedtime as needed.    [provider]  esomeprazole  (NEXIUM) 20 MG capsule Take 1 capsule shortly before breakfast meal. 01/01/20   Milus Banister, MD  eszopiclone (LUNESTA) 2 MG TABS tablet Take 1 tablet (2 mg total) by mouth at bedtime as needed for sleep. Take immediately before bedtime 09/02/20   Jearld Fenton, NP  famotidine (PEPCID) 20 MG tablet Take 1 tablet daily at bedtime as needed. 01/01/20   Milus Banister, MD  GUAIFENESIN AC 100-10 MG/5ML syrup TAKE 5 ML BY MOUTH  AT BEDTIME AS NEEDED 09/17/20   Jearld Fenton, NP  predniSONE (DELTASONE) 10 MG tablet Take 6 tabs day 1, 5 tabs day 2, 4 tabs day 3, 3 tabs day 4, 2 tabs day 5, 1 tab day 6 09/15/20   Jearld Fenton, NP    Allergies    Codeine  Review of Systems   Review of Systems  Constitutional:  Negative for chills and fever.  HENT:  Negative for congestion.   Eyes:  Negative for visual disturbance.  Respiratory:  Negative for cough and shortness of breath.   Cardiovascular:  Positive for chest pain. Negative for palpitations and leg swelling.  Gastrointestinal:  Negative for abdominal pain, diarrhea and vomiting.  Genitourinary:  Negative for dysuria.  Skin:  Negative for rash.  Neurological:  Negative  for headaches.   Physical Exam Updated Vital Signs BP 116/70   Pulse 64   Temp 99 F (37.2 C) (Oral)   Resp 18   SpO2 100%   Physical Exam Vitals and nursing note reviewed.  Constitutional:      General: She is not in acute distress.    Appearance: Normal appearance. She is not diaphoretic.  HENT:     Head: Normocephalic.     Mouth/Throat:     Mouth: Mucous membranes are moist.  Cardiovascular:     Rate and Rhythm: Normal rate.  Pulmonary:     Effort: Pulmonary effort is normal. No tachypnea or respiratory distress.  Chest:     Chest wall: No tenderness or crepitus.  Abdominal:     Palpations: Abdomen is soft.     Tenderness: There is no abdominal tenderness.  Musculoskeletal:     Right lower leg: No edema.     Left lower leg: No edema.  Skin:     General: Skin is warm.  Neurological:     Mental Status: She is alert and oriented to person, place, and time. Mental status is at baseline.  Psychiatric:        Mood and Affect: Mood normal.    ED Results / Procedures / Treatments   Labs (all labs ordered are listed, but only abnormal results are displayed) Labs Reviewed  BASIC METABOLIC PANEL - Abnormal; Notable for the following components:      Result Value   Glucose, Bld 105 (*)    All other components within normal limits  CBC WITH DIFFERENTIAL/PLATELET  TROPONIN I (HIGH SENSITIVITY)  TROPONIN I (HIGH SENSITIVITY)    EKG EKG Interpretation  Date/Time:  Thursday May 07 2021 15:48:02 EDT Ventricular Rate:  77 PR Interval:  124 QRS Duration: 72 QT Interval:  380 QTC Calculation: 430 R Axis:   4 Text Interpretation: Normal sinus rhythm Normal ECG NSR, similar to previous Confirmed by Lavenia Atlas [phone]) on 05/07/2021 9:25:05 PM  Radiology DG Chest 1 View  Result Date: 05/07/2021 CLINICAL DATA:  Chest pain EXAM: CHEST  1 VIEW COMPARISON:  Chest x-ray dated September 15, 2020 FINDINGS: The heart size and mediastinal contours are within normal limits. Both lungs are clear. The visualized skeletal structures are unremarkable. IMPRESSION: No active disease. Electronically Signed   By: Yetta Glassman M.D.   On: 05/07/2021 16:50    Procedures Procedures   Medications Ordered in ED Medications - No data to display  ED Course  I have reviewed the triage vital signs and the nursing notes.  Pertinent labs & imaging results that were available during my care of the patient were reviewed by me and considered in my medical decision making (see chart for details).    MDM Rules/Calculators/A&P                           60 year old female presents the emergency department concern for intermittent sharp left-sided chest pain that is currently resolved.  No cardiac history.  Vitals are normal and stable on arrival.  No  associated shortness of breath, tachypnea or hypoxia.  EKG is normal sinus rhythm, no ischemic changes, similar to previous.  Chest x-ray is unremarkable.  Blood work is normal and reassuring, 2 negative troponins with no significant delta.  Low suspicion for ACS, she is a low heart score.  Low suspicion for PE/dissection given the absence of symptoms and current presentation.  Plan for outpatient  follow-up.  Patient at this time appears safe and stable for discharge and will be treated as an outpatient.  Discharge plan and strict return to ED precautions discussed, patient verbalizes understanding and agreement.  Final Clinical Impression(s) / ED Diagnoses Final diagnoses:  Chest pain, unspecified type    Rx / DC Orders ED Discharge Orders     None        Lorelle Gibbs, DO 05/07/21 2128

## 2021-05-07 NOTE — ED Provider Notes (Signed)
[name]    CSN: [id number] Arrival date & time: 05/07/21  1433      History   Chief Complaint Chief Complaint  Patient presents with   Chest Pain    HPI Katie Townsend is a 60 y.o. female.   Patient presents with left-sided chest pain that radiates down her left arm that has been present for approximately 4 days.  Patient states that pain is stabbing and is intermittent.  Denies any numbness or tingling to left arm.  Patient also endorses fatigue that started around the same time.  Also been having some intense burping.  Patient has been taking ibuprofen with no relief of pain.  Denies any cardiac or lung chronic illnesses.  Denies any shortness of breath or current chest pain.   Chest Pain  Past Medical History:  Diagnosis Date   Arthritis    Depression    GERD (gastroesophageal reflux disease)     Patient Active Problem List   Diagnosis Date Noted   Chronic midline low back pain without sciatica 07/14/2018   Insomnia 02/18/2018   GERD (gastroesophageal reflux disease) 08/21/2014   Anxiety 08/21/2014    Past Surgical History:  Procedure Laterality Date   ABDOMINAL HYSTERECTOMY  2009   APPENDECTOMY  1978   EYE SURGERY Right 1976    OB History   No obstetric history on file.      Home Medications    Prior to Admission medications   Medication Sig Start Date End Date Taking? Authorizing Provider  diphenhydrAMINE (BENADRYL) 25 MG tablet Take 25 mg by mouth at bedtime as needed.   Yes [provider]  esomeprazole (NEXIUM) 20 MG capsule Take 1 capsule shortly before breakfast meal. 01/01/20  Yes Milus Banister, MD  eszopiclone (LUNESTA) 2 MG TABS tablet Take 1 tablet (2 mg total) by mouth at bedtime as needed for sleep. Take immediately before bedtime 09/02/20   Jearld Fenton, NP  famotidine (PEPCID) 20 MG tablet Take 1 tablet daily at bedtime as needed. 01/01/20   Milus Banister, MD  GUAIFENESIN AC 100-10 MG/5ML syrup TAKE 5 ML BY  MOUTH  AT BEDTIME AS NEEDED 09/17/20   Jearld Fenton, NP  predniSONE (DELTASONE) 10 MG tablet Take 6 tabs day 1, 5 tabs day 2, 4 tabs day 3, 3 tabs day 4, 2 tabs day 5, 1 tab day 6 09/15/20   Baity, Coralie Keens, NP    Family History Family History  Problem Relation Age of Onset   Arthritis Mother    Hyperlipidemia Mother    Heart disease Mother    Hypertension Mother    Anxiety disorder Mother    Diabetes Mother    Alcohol abuse Father    Arthritis Father    Mental illness Father    Diabetes Sister    Diabetes Brother    Diabetes Maternal Aunt     Social History Social History   Tobacco Use   Smoking status: Never   Smokeless tobacco: Never  Substance Use Topics   Alcohol use: No    Alcohol/week: 0.0 standard drinks   Drug use: No     Allergies   Codeine   Review of Systems Review of Systems Per HPI  Physical Exam Triage Vital Signs ED Triage Vitals  Enc Vitals Group     BP 05/07/21 1439 119/83     Pulse Rate 05/07/21 1439 97     Resp 05/07/21 1439 18     Temp 05/07/21 1439  98.2 F (36.8 C)     Temp Source 05/07/21 1439 Oral     SpO2 05/07/21 1439 97 %     Weight 05/07/21 1441 200 lb (90.7 kg)     Height 05/07/21 1441 5' 8.5" (1.74 m)     Head Circumference --      Peak Flow --      Pain Score 05/07/21 1441 4     Pain Loc --      Pain Edu? --      Excl. in [place]? --    No data found.  Updated Vital Signs BP 119/83 (BP Location: Right Arm)   Pulse 97   Temp 98.2 F (36.8 C) (Oral)   Resp 18   Ht 5' 8.5" (1.74 m)   Wt 200 lb (90.7 kg)   SpO2 97%   BMI 29.97 kg/m   Visual Acuity Right Eye Distance:   Left Eye Distance:   Bilateral Distance:    Right Eye Near:   Left Eye Near:    Bilateral Near:     Physical Exam Constitutional:      General: She is not in acute distress.    Appearance: Normal appearance. She is not ill-appearing, toxic-appearing or diaphoretic.  HENT:     Head: Normocephalic and atraumatic.  Eyes:     Extraocular  Movements: Extraocular movements intact.     Conjunctiva/sclera: Conjunctivae normal.  Cardiovascular:     Rate and Rhythm: Normal rate and regular rhythm.     Pulses: Normal pulses.     Heart sounds: Normal heart sounds.  Pulmonary:     Effort: Pulmonary effort is normal. No respiratory distress.     Breath sounds: Normal breath sounds.  Skin:    General: Skin is warm and dry.  Neurological:     General: No focal deficit present.     Mental Status: She is alert and oriented to person, place, and time. Mental status is at baseline.  Psychiatric:        Mood and Affect: Mood normal.        Behavior: Behavior normal.        Thought Content: Thought content normal.        Judgment: Judgment normal.     UC Treatments / Results  Labs (all labs ordered are listed, but only abnormal results are displayed) Labs Reviewed - No data to display  EKG   Radiology No results found.  Procedures Procedures (including critical care time)  Medications Ordered in UC Medications - No data to display  Initial Impression / Assessment and Plan / UC Course  I have reviewed the triage vital signs and the nursing notes.  Pertinent labs & imaging results that were available during my care of the patient were reviewed by me and considered in my medical decision making (see chart for details).     EKG showing nonspecific T wave abnormality but otherwise unremarkable.  Advised patient that she needs to go to the hospital for further evaluation and management.  Vital signs were stable so do not think patient needs EMS transport at this time. Agree with self transport to hospital. Patient was agreeable with plan.  Final Clinical Impressions(s) / UC Diagnoses   Final diagnoses:  Other chest pain     Discharge Instructions      Please go to the hospital as soon as you leave urgent care.      ED Prescriptions   None    PDMP not reviewed this  encounter.   Odis Luster, FNP 05/07/21  1530

## 2021-05-07 NOTE — ED Triage Notes (Signed)
Patient c/o left sided chest pain that radiates to her left arm/clavicle area x 4 days.  Patient is fatigue, tired and "burping" a lot.  Patient has been taken some Ibuprofen at night.  No cardiac history.

## 2021-05-07 NOTE — ED Triage Notes (Signed)
Patient complains of left sided chest pain and intermittent fatigue that started a few days ago. Denies shortness of breath, denies nausea.

## 2021-05-07 NOTE — Discharge Instructions (Addendum)
You have been seen and discharged from the emergency department.  Your heart and lung work-up was normal.  Follow-up with your primary provider for reevaluation and further care. Take home medications as prescribed. If you have any worsening symptoms or further concerns for your health please return to an emergency department for further evaluation.

## 2021-05-15 ENCOUNTER — Encounter: Payer: Self-pay | Admitting: Gastroenterology

## 2021-05-28 ENCOUNTER — Telehealth: Payer: Self-pay | Admitting: *Deleted

## 2021-05-28 NOTE — Telephone Encounter (Signed)
Patient in ER earlier this month with chest pain that ruled out negative.  Advised patient we would need clearance from her primary care before we could proceed.  Patient verbalized understanding.

## 2021-05-29 LAB — HM MAMMOGRAPHY

## 2021-06-01 ENCOUNTER — Ambulatory Visit (INDEPENDENT_AMBULATORY_CARE_PROVIDER_SITE_OTHER)
Admission: RE | Admit: 2021-06-01 | Discharge: 2021-06-01 | Disposition: A | Discharge status: Home / Self Care | Payer: 59 | Source: Ambulatory Visit | Attending: Nurse Practitioner | Admitting: Nurse Practitioner

## 2021-06-01 ENCOUNTER — Ambulatory Visit (INDEPENDENT_AMBULATORY_CARE_PROVIDER_SITE_OTHER): Payer: 59 | Admitting: Nurse Practitioner

## 2021-06-01 ENCOUNTER — Other Ambulatory Visit: Payer: Self-pay

## 2021-06-01 ENCOUNTER — Encounter: Payer: Self-pay | Admitting: Nurse Practitioner

## 2021-06-01 VITALS — BP 116/72 | HR 73 | Temp 98.1°F | Resp 10 | Ht 68.5 in | Wt 204.4 lb

## 2021-06-01 DIAGNOSIS — M542 Cervicalgia: Secondary | ICD-10-CM | POA: Diagnosis not present

## 2021-06-01 DIAGNOSIS — M2559 Pain in other specified joint: Secondary | ICD-10-CM

## 2021-06-01 DIAGNOSIS — K219 Gastro-esophageal reflux disease without esophagitis: Secondary | ICD-10-CM

## 2021-06-01 DIAGNOSIS — Z23 Encounter for immunization: Secondary | ICD-10-CM

## 2021-06-01 DIAGNOSIS — M2550 Pain in unspecified joint: Secondary | ICD-10-CM | POA: Insufficient documentation

## 2021-06-01 DIAGNOSIS — R0789 Other chest pain: Secondary | ICD-10-CM

## 2021-06-01 DIAGNOSIS — G479 Sleep disorder, unspecified: Secondary | ICD-10-CM | POA: Diagnosis not present

## 2021-06-01 DIAGNOSIS — F419 Anxiety disorder, unspecified: Secondary | ICD-10-CM

## 2021-06-01 LAB — SEDIMENTATION RATE: Sed Rate: 3 mm/hr (ref 0–30)

## 2021-06-01 MED ORDER — HYDROXYZINE PAMOATE 25 MG PO CAPS: ORAL_CAPSULE | ORAL | 0 refills | Status: DC

## 2021-06-01 NOTE — Assessment & Plan Note (Signed)
Patient complains of several joints that bother her some head trauma in the past some have not.  Described over the past couple months along with stiffness that lasts less than 5 minutes no history of autoimmune disease.  We will check ANA and RF factor in office today pending lab results.  Continue to monitor

## 2021-06-01 NOTE — Assessment & Plan Note (Signed)
States history of the same.  Has a history of herniated disks per patient report.  Does use traction and inversion table at home with great relief.  Patient is complaining about on and off joint aches and pains for 2 months along with stiffness that lasts less than 5 minutes every morning.  We will update imaging of neck.  Continue to monitor

## 2021-06-01 NOTE — Assessment & Plan Note (Addendum)
Has been on Lunesta in the past with some result.  Has been off for several months according the patient.  Before starting back on Lunesta we will try hydroxyzine 25 to 50 mg nightly. Did discuss the possibility of sleep apnea being a culprit.  Patient did state that she does snore.  Wakes up sometimes feeling rested other times not.

## 2021-06-01 NOTE — Assessment & Plan Note (Signed)
Patiently currently maintained on Nexium and famotidine as needed.  She does avoid triggers and tries not to eat close to bedtime.  Patient states she stopped eating around 6 PM if possible.  Continue Nexium and famotidine as needed.  Patient states she has not had an upper GI series before.

## 2021-06-01 NOTE — Progress Notes (Signed)
Established Patient Office Visit  Subjective:  Patient ID: Katie Townsend, female    DOB: 1961/02/16  Age: 60 y.o. MRN: [id number]  CC:  Chief Complaint  Patient presents with   Transfer of Care   Chest Pain    Went to Urgent Care and ER on 05/07/21. Still having some left side chest discomfort. Thinks maybe it is muscle/joint related. Off and on.   Joint Pain    Several areas she has noticed- neck, elbows, wrists, and finger on the left hand. Noticed more the past 2 months.   Referral    For colonoscopy-last was in 2012 in [place]    HPI Earnstine Meinders presents for [facility]  GERD: Placed on by [facility]. Have not had endoscopy done. Years.  Has forgotten to take it and was symptomatic.  Insomnia: States she has tried [place] in the past that has helped. States she can get to sleep just wake ups at 2 or 3 am.  Anxiety: Mind racing and excessive worrying. Has tried different treatments in the past. Has tired effexor, not really effective. Has tried therapy in the past with minimal relief.  Chest pain: Went to UC they seen then sent to ED. Was discharged. States that it is intermittent in nature. Described as a dull ache that radiates to her left shoulder. History of GERD. Nothing makes the discomfort better or worse per patient report.  Joint pains: Neck elbows, wrist and fingers. Couple months that is intermittent in nature. Worse in the morning. Stiffiness that last less than 5 minutes. No swelling or warm tenderness to joints.  History of cervical disc hernation history. Has traction unit that she uses to help with the neck pain   Past Medical History:  Diagnosis Date   Arthritis    Depression    GERD (gastroesophageal reflux disease)     Past Surgical History:  Procedure Laterality Date   ABDOMINAL HYSTERECTOMY  2009   APPENDECTOMY  1978   EYE SURGERY Right 1976    Family History  Problem Relation Age of Onset   Arthritis Mother    Hyperlipidemia Mother     Heart disease Mother    Hypertension Mother    Anxiety disorder Mother    Diabetes Mother    Alcohol abuse Father    Arthritis Father    Mental illness Father    Diabetes Sister    Diabetes Brother    Diabetes Maternal Aunt     Social History   Socioeconomic History   Marital status: Married    Spouse name: Not on file   Number of children: Not on file   Years of education: Not on file   Highest education level: Not on file  Occupational History   Not on file  Tobacco Use   Smoking status: Never   Smokeless tobacco: Never  Substance and Sexual Activity   Alcohol use: No    Alcohol/week: 0.0 standard drinks   Drug use: No   Sexual activity: Yes    Birth control/protection: Condom, Surgical  Other Topics Concern   Not on file  Social History Narrative   Not on file   Social Determinants of Health   Financial Resource Strain: Not on file  Food Insecurity: Not on file  Transportation Needs: Not on file  Physical Activity: Not on file  Stress: Not on file  Social Connections: Not on file  Intimate Partner Violence: Not on file    Outpatient Medications Prior to Visit  Medication  Sig Dispense Refill   diphenhydrAMINE (BENADRYL) 25 MG tablet Take 25 mg by mouth at bedtime as needed.     esomeprazole (NEXIUM) 20 MG capsule Take 1 capsule shortly before breakfast meal.     eszopiclone (LUNESTA) 2 MG TABS tablet Take 1 tablet (2 mg total) by mouth at bedtime as needed for sleep. Take immediately before bedtime 20 tablet 0   famotidine (PEPCID) 20 MG tablet Take 1 tablet daily at bedtime as needed.     GUAIFENESIN AC 100-10 MG/5ML syrup TAKE 5 ML BY MOUTH  AT BEDTIME AS NEEDED 120 mL 0   predniSONE (DELTASONE) 10 MG tablet Take 6 tabs day 1, 5 tabs day 2, 4 tabs day 3, 3 tabs day 4, 2 tabs day 5, 1 tab day 6 21 tablet 0   No facility-administered medications prior to visit.    Allergies  Allergen Reactions   Codeine Nausea Only    ROS Review of Systems   Constitutional:  Negative for chills and fever.  Respiratory:  Negative for cough and shortness of breath.   Cardiovascular:  Negative for chest pain, palpitations and leg swelling.  Gastrointestinal:  Negative for diarrhea, nausea and vomiting.  Neurological:  Negative for weakness.  Psychiatric/Behavioral:  Negative for hallucinations and suicidal ideas.      Objective:    Physical Exam Vitals and nursing note reviewed.  Constitutional:      Appearance: She is well-developed.  Cardiovascular:     Rate and Rhythm: Normal rate and regular rhythm.     Pulses:          Radial pulses are 2+ on the right side and 2+ on the left side.       Dorsalis pedis pulses are 2+ on the right side and 2+ on the left side.  Pulmonary:     Effort: Pulmonary effort is normal.  Abdominal:     General: Bowel sounds are normal.  Musculoskeletal:     Cervical back: No tenderness.     Right lower leg: No edema.     Left lower leg: No edema.  Lymphadenopathy:     Cervical: No cervical adenopathy.  Skin:    General: Skin is warm.  Neurological:     Mental Status: She is alert.    BP 116/72   Pulse 73   Temp 98.1 F (36.7 C)   Resp 10   Ht 5' 8.5" (1.74 m)   Wt 204 lb 7 oz (92.7 kg)   SpO2 98%   BMI 30.63 kg/m  Wt Readings from Last 3 Encounters:  06/01/21 204 lb 7 oz (92.7 kg)  05/07/21 200 lb (90.7 kg)  09/15/20 200 lb (90.7 kg)     Health Maintenance Due  Topic Date Due   Zoster Vaccines- Shingrix (1 of 2) Never done   COVID-19 Vaccine (3 - Pfizer risk series) 06/26/2020   COLONOSCOPY (Pts 45-37yrs Insurance coverage will need to be confirmed)  09/06/2020   INFLUENZA VACCINE  04/06/2021    There are no preventive care reminders to display for this patient.  Lab Results  Component Value Date   TSH 1.29 12/31/2015   Lab Results  Component Value Date   WBC 8.8 05/07/2021   HGB 13.1 05/07/2021   HCT 39.0 05/07/2021   MCV 95.1 05/07/2021   PLT 265 05/07/2021   Lab  Results  Component Value Date   NA 139 05/07/2021   K 3.6 05/07/2021   CO2 29 05/07/2021   GLUCOSE 105 (  H) 05/07/2021   BUN 11 05/07/2021   CREATININE 0.88 05/07/2021   BILITOT 0.8 09/02/2020   ALKPHOS 63 09/02/2020   AST 21 09/02/2020   ALT 27 09/02/2020   PROT 7.2 09/02/2020   ALBUMIN 4.6 09/02/2020   CALCIUM 9.6 05/07/2021   ANIONGAP 8 05/07/2021   GFR 89.65 09/02/2020   Lab Results  Component Value Date   CHOL 215 (H) 09/02/2020   Lab Results  Component Value Date   HDL 47.90 09/02/2020   Lab Results  Component Value Date   LDLCALC 142 (H) 09/02/2020   Lab Results  Component Value Date   TRIG 125.0 09/02/2020   Lab Results  Component Value Date   CHOLHDL 4 09/02/2020   Lab Results  Component Value Date   HGBA1C 5.8 09/02/2020      Assessment & Plan:   Problem List Items Addressed This Visit       Digestive   GERD (gastroesophageal reflux disease)    Patiently currently maintained on Nexium and famotidine as needed.  She does avoid triggers and tries not to eat close to bedtime.  Patient states she stopped eating around 6 PM if possible.  Continue Nexium and famotidine as needed.  Patient states she has not had an upper GI series before.        Other   Anxiety    Does have periods of anxiety.  Has tried Effexor and therapy in the past.  States therapy was minimally helpful and Effexor was not helpful at all.  He is having some difficulty with sleep.  Has been off of all sleep aids for the past approximately 6 months.  We will try hydroxyzine to see if we can help anxiety and sleep disturbance with medications.  She does take Benadryl as needed as needed discussed that she cannot take both medications on the same day.  She acknowledged and understood.  Start hydroxyzine 25 to 50 mg nightly as needed      Relevant Medications   hydrOXYzine (VISTARIL) 25 MG capsule   Trouble in sleeping    Has been on Lunesta in the past with some result.  Has been off  for several months according the patient.  Before starting back on Lunesta we will try hydroxyzine 25 to 50 mg nightly. Did discuss the possibility of sleep apnea being a culprit.  Patient did state that she does snore.  Wakes up sometimes feeling rested other times not.      Relevant Medications   hydrOXYzine (VISTARIL) 25 MG capsule   Neck pain - Primary    States history of the same.  Has a history of herniated disks per patient report.  Does use traction and inversion table at home with great relief.  Patient is complaining about on and off joint aches and pains for 2 months along with stiffness that lasts less than 5 minutes every morning.  We will update imaging of neck.  Continue to monitor      Relevant Orders   DG Cervical Spine Complete   Joint pain    Patient complains of several joints that bother her some head trauma in the past some have not.  Described over the past couple months along with stiffness that lasts less than 5 minutes no history of autoimmune disease.  We will check ANA and RF factor in office today pending lab results.  Continue to monitor      Relevant Orders   ANA   Sedimentation Rate  Rheumatoid factor   Chest discomfort    Was seen in urgent care and emergency department.  Was cleared of cardiac etiology.  Patient still having intermittently.  Could be GERD, anxiety, cardiac in nature.  We will try some medication to help with her anxiety to see if it resolves.  If it does not we can send to cardiology for further work-up to make sure it is not cardiac in etiology.  Continue to monitor      Other Visit Diagnoses     Need for influenza vaccination       Relevant Orders   Flu Vaccine QUAD 6+ mos PF IM (Fluarix Quad PF) (Completed)       No orders of the defined types were placed in this encounter.   Follow-up: Return in about 3 months (around 09/04/2021) for after 12/28 for CPE and labs.   This visit occurred during the SARS-CoV-2 public health  emergency.  Safety protocols were in place, including screening questions prior to the visit, additional usage of staff PPE, and extensive cleaning of exam room while observing appropriate contact time as indicated for disinfecting solutions.   Romilda Garret, NP

## 2021-06-01 NOTE — Assessment & Plan Note (Signed)
Does have periods of anxiety.  Has tried Effexor and therapy in the past.  States therapy was minimally helpful and Effexor was not helpful at all.  He is having some difficulty with sleep.  Has been off of all sleep aids for the past approximately 6 months.  We will try hydroxyzine to see if we can help anxiety and sleep disturbance with medications.  She does take Benadryl as needed as needed discussed that she cannot take both medications on the same day.  She acknowledged and understood.  Start hydroxyzine 25 to 50 mg nightly as needed

## 2021-06-01 NOTE — Patient Instructions (Signed)
Nice to see you  Let me know how medication works Do NOT take benadryl and the hydroxyzine the same days Will see you in December for your physical

## 2021-06-01 NOTE — Assessment & Plan Note (Signed)
Was seen in urgent care and emergency department.  Was cleared of cardiac etiology.  Patient still having intermittently.  Could be GERD, anxiety, cardiac in nature.  We will try some medication to help with her anxiety to see if it resolves.  If it does not we can send to cardiology for further work-up to make sure it is not cardiac in etiology.  Continue to monitor

## 2021-06-02 ENCOUNTER — Encounter: Payer: Self-pay | Admitting: Nurse Practitioner

## 2021-06-02 LAB — ANA: Anti Nuclear Antibody (ANA): NEGATIVE

## 2021-06-02 LAB — RHEUMATOID FACTOR: Rheumatoid fact SerPl-aCnc: <14 IU/mL (ref <14)

## 2021-06-03 NOTE — Telephone Encounter (Signed)
Pt had visit on 06/01/21 and report for cervical spine complete 06/01/21 in Epic. Sending note to Jannette Spanner NP, Azalee Course CMA and will teams Jannette Spanner NP.

## 2021-06-09 ENCOUNTER — Other Ambulatory Visit: Payer: Self-pay

## 2021-06-09 ENCOUNTER — Encounter: Payer: Self-pay | Admitting: Gastroenterology

## 2021-06-09 ENCOUNTER — Encounter: Payer: Self-pay | Admitting: Nurse Practitioner

## 2021-06-09 ENCOUNTER — Ambulatory Visit: Payer: 59 | Admitting: *Deleted

## 2021-06-09 VITALS — Ht 68.5 in | Wt 204.0 lb

## 2021-06-09 DIAGNOSIS — Z1211 Encounter for screening for malignant neoplasm of colon: Secondary | ICD-10-CM

## 2021-06-09 NOTE — Telephone Encounter (Signed)
Forwarding to Dr. Ardis Hughs as she is his patient. Thanks. GM

## 2021-06-09 NOTE — Progress Notes (Signed)
Virtual pre-visit completed.  Instructions forwarded through e-mail.  No egg or soy allergy known to patient  No issues known to pt with past sedation with any surgeries or procedures Patient denies ever being told they had issues or difficulty with intubation  No FH of Malignant Hyperthermia Pt is not on diet pills Pt is not on  home 02  Pt is not on blood thinners  Pt denies issues with constipation  No A fib or A flutter  EMMI video to pt or via MyChart   Pt is fully vaccinated  for Covid  Due to the COVID-19 pandemic we are asking patients to follow certain guidelines.  Pt aware of COVID protocols and [facility] guidelines

## 2021-06-09 NOTE — Telephone Encounter (Signed)
Dr Rush Landmark, This patient is coming in for PV today.  I see she saw a NP after her episode of chest pain, but they are taking a wait and see approach.  What do you want me to tell her today, or do you want to proceed? Thanks!

## 2021-06-10 ENCOUNTER — Other Ambulatory Visit: Payer: Self-pay | Admitting: Nurse Practitioner

## 2021-06-10 DIAGNOSIS — R0789 Other chest pain: Secondary | ICD-10-CM

## 2021-06-10 NOTE — Telephone Encounter (Signed)
Contacted patient and advised we would need clearance either from her primary care or a cardiologist.  Patient wanted to keep same appointment but advised that she would need to be rescheduled with DR Ardis Hughs as he is who she saw previously.  Patient will call and reschedule once clearance is obtained and there is clearance in Epic or  letter from physician.

## 2021-06-16 ENCOUNTER — Encounter: Payer: 59 | Admitting: Gastroenterology

## 2021-06-22 ENCOUNTER — Other Ambulatory Visit: Payer: Self-pay

## 2021-06-22 ENCOUNTER — Ambulatory Visit (INDEPENDENT_AMBULATORY_CARE_PROVIDER_SITE_OTHER): Payer: 59 | Admitting: Cardiovascular Disease

## 2021-06-22 ENCOUNTER — Encounter: Payer: Self-pay | Admitting: Cardiovascular Disease

## 2021-06-22 VITALS — BP 130/80 | HR 71 | Ht 68.5 in | Wt 204.5 lb

## 2021-06-22 DIAGNOSIS — R0789 Other chest pain: Secondary | ICD-10-CM

## 2021-06-22 DIAGNOSIS — E782 Mixed hyperlipidemia: Secondary | ICD-10-CM | POA: Diagnosis not present

## 2021-06-22 DIAGNOSIS — K219 Gastro-esophageal reflux disease without esophagitis: Secondary | ICD-10-CM

## 2021-06-22 NOTE — Progress Notes (Signed)
Cardiology Office Note  Date:  06/22/2021   ID:  Katie Townsend, DOB [birth year], MRN [id number]  PCP:  Katie Pitcher, NP   Chief Complaint  Patient presents with   New Patient (Initial Visit)    Ref by Karl Ito, NP for chest discomfort. Patient c/o chest discomfort that radiates up into her left shoulder that comes and goes.Medications reviewed by the patient verbally.     HPI:  [name] is a 60 year old woman with past medical history of GERD Who presents by referral from Katie Townsend for consultation of her chest pain  For the past months she reports having chest pain on the left side radiating up to left scapula Does not feel like musculoskeletal pain, does not hurt on palpation or movement of her left arm Has not noticed an association with exertion Worse in evening Comes and goes, few minutes  Hiked this past weekend, no chest pain  Has noticed symptoms when she has the left-sided chest pain, repeated bouts of belching will eventually help relieve her symptoms  She did call primary care office with concerns of her chest pain, was prior to the emergency room but she went to urgent care, told her EKG was abnormal and she is referred to the ER In the ER May 07, 2021  Work-up negative  Non-smoker No diabetes  EKG personally reviewed by myself on todays visit Shows normal sinus rhythm rate 71 bpm no significant ST-T wave changes  PMH:   has a past medical history of Arthritis, Depression, and GERD (gastroesophageal reflux disease).  PSH:    Past Surgical History:  Procedure Laterality Date   ABDOMINAL HYSTERECTOMY  09/07/2007   Partial left ovaries   APPENDECTOMY  09/06/1976   COLONOSCOPY     EYE SURGERY Right 09/06/1974    Current Outpatient Medications  Medication Sig Dispense Refill   esomeprazole (NEXIUM) 20 MG capsule Take 1 capsule shortly before breakfast meal.     famotidine (PEPCID) 20 MG tablet Take 1 tablet daily at bedtime as needed.      hydrOXYzine (VISTARIL) 25 MG capsule 25mg -50mg  every evening at bedtime 45 capsule 0   Multiple Vitamins-Minerals (MULTIPLE VITAMINS/WOMENS PO) Take by mouth.     vitamin B-12 (CYANOCOBALAMIN) 100 MCG tablet Take 100 mcg by mouth daily.     VITAMIN D, CHOLECALCIFEROL, PO Take by mouth.     eszopiclone (LUNESTA) 2 MG TABS tablet Take 1 tablet (2 mg total) by mouth at bedtime as needed for sleep. Take immediately before bedtime (Patient not taking: No sig reported) 20 tablet 0   Polyethylene Glycol 3350 (MIRALAX PO) Take by mouth. Colonoscopy prep (Patient not taking: Reported on 06/22/2021)     No current facility-administered medications for this visit.    Allergies:   Codeine   Social History:  The patient  reports that she has never smoked. She has never used smokeless tobacco. She reports that she does not drink alcohol and does not use drugs.   Family History:   family history includes Alcohol abuse in her father; Anxiety disorder in her mother; Arthritis in her father and mother; Colon polyps in her sister; Diabetes in her brother, maternal aunt, mother, and sister; Heart disease in her mother; Hyperlipidemia in her mother; Hypertension in her mother; Mental illness in her father.    Review of Systems: Review of Systems  Constitutional: Negative.   HENT: Negative.    Respiratory: Negative.    Cardiovascular: Negative.   Gastrointestinal: Negative.  Musculoskeletal: Negative.   Neurological: Negative.   Psychiatric/Behavioral: Negative.    All other systems reviewed and are negative.   PHYSICAL EXAM: VS:  BP 130/80 (BP Location: Right Arm, Patient Position: Sitting, Cuff Size: Normal)   Pulse 71   Ht 5' 8.5" (1.74 m)   Wt 204 lb 8 oz (92.8 kg)   SpO2 98%   BMI 30.64 kg/m  , BMI Body mass index is 30.64 kg/m. GEN: Well nourished, well developed, in no acute distress HEENT: normal Neck: no JVD, carotid bruits, or masses Cardiac: RRR; no murmurs, rubs, or gallops,no  edema  Respiratory:  clear to auscultation bilaterally, normal work of breathing GI: soft, nontender, nondistended, + BS MS: no deformity or atrophy Skin: warm and dry, no rash Neuro:  Strength and sensation are intact Psych: euthymic mood, full affect   Recent Labs: 09/02/2020: ALT 27 05/07/2021: BUN 11; Creatinine, Ser 0.88; Hemoglobin 13.1; Platelets 265; Potassium 3.6; Sodium 139    Lipid Panel Lab Results  Component Value Date   CHOL 215 (H) 09/02/2020   HDL 47.90 09/02/2020   LDLCALC 142 (H) 09/02/2020   TRIG 125.0 09/02/2020      Wt Readings from Last 3 Encounters:  06/22/21 204 lb 8 oz (92.8 kg)  06/09/21 204 lb (92.5 kg)  06/01/21 204 lb 7 oz (92.7 kg)     ASSESSMENT AND PLAN:  Problem List Items Addressed This Visit     GERD (gastroesophageal reflux disease)   Chest discomfort - Primary   Relevant Orders   EKG 12-Lead   Chest pain Atypical in nature Often relieved after series of belches Does not appear to be musculoskeletal, it is not associated with exertion Other etiology would be inflammatory such as pericarditis/myopericarditis Slowly getting better over the past several weeks No limitations to exercise at this time Recommended she continue her PPI, add simethicone, Gas-X, carbonated soda to help with belching as needed, avoid gassy foods such as vegetables in excess -Recommend she consider discussing with GI, may need EGD at the time of her upcoming colonoscopy.  Unable to exclude hiatal hernia or other pathology  Preoperative cardiac evaluation Acceptable risk for colonoscopy, no further cardiac work-up needed Would consider EGD in addition to her colonoscopy if possible  GERD On PPI as above  Hyperlipidemia Non-smoker, no diabetes Could consider CT coronary calcium scoring if risk stratification desired if chest pain persists Information provided, she will call us if she would like this ordered   Total encounter time more than 60 minutes   Greater than 50% was spent in counseling and coordination of care with the patient  Patient seen in consultation for Katie Townsend and will be referred back to his office for ongoing care of the issues detailed above  Signed, Esmond Plants, M.D., Ph.D. [place], [employer]

## 2021-06-22 NOTE — Patient Instructions (Addendum)
Medication Instructions:  No changes Gas-x, carbonated soda Watch the veggies  If you need a refill on your cardiac medications before your next appointment, please call your pharmacy.   Lab work: No new labs needed  Testing/Procedures: Please read attach article on CT Calcium Score   Follow-Up: At [facility], you and your health needs are our priority.  As part of our continuing mission to provide you with exceptional heart care, we have created designated Provider Care Teams.  These Care Teams include your primary Cardiologist (physician) and Advanced Practice Providers (APPs -  Physician Assistants and Nurse Practitioners) who all work together to provide you with the care you need, when you need it.  You will need a follow up appointment as needed  Providers on your designated Care Team:   Murray Hodgkins, NP Christell Faith, PA-C Marrianne Mood, PA-C Cadence Raymond, [place]  COVID-19 Vaccine Information can be found at: [email or web address].co.uk For questions related to vaccine distribution or appointments, please email [email or web address] or call [phone].

## 2021-07-09 ENCOUNTER — Other Ambulatory Visit: Payer: Self-pay | Admitting: Nurse Practitioner

## 2021-07-09 DIAGNOSIS — G479 Sleep disorder, unspecified: Secondary | ICD-10-CM

## 2021-07-10 MED ORDER — HYDROXYZINE PAMOATE 25 MG PO CAPS: ORAL_CAPSULE | ORAL | 2 refills | Status: DC

## 2021-07-13 ENCOUNTER — Ambulatory Visit: Payer: 59 | Admitting: Cardiovascular Disease

## 2021-09-03 ENCOUNTER — Other Ambulatory Visit: Payer: Self-pay

## 2021-09-03 ENCOUNTER — Encounter: Payer: Self-pay | Admitting: Nurse Practitioner

## 2021-09-03 ENCOUNTER — Ambulatory Visit (INDEPENDENT_AMBULATORY_CARE_PROVIDER_SITE_OTHER): Payer: 59 | Admitting: Nurse Practitioner

## 2021-09-03 VITALS — BP 130/82 | HR 70 | Temp 96.8°F | Resp 12 | Ht 68.5 in | Wt 204.1 lb

## 2021-09-03 DIAGNOSIS — M5432 Sciatica, left side: Secondary | ICD-10-CM | POA: Diagnosis not present

## 2021-09-03 DIAGNOSIS — G479 Sleep disorder, unspecified: Secondary | ICD-10-CM | POA: Diagnosis not present

## 2021-09-03 DIAGNOSIS — Z0000 Encounter for general adult medical examination without abnormal findings: Secondary | ICD-10-CM | POA: Diagnosis not present

## 2021-09-03 DIAGNOSIS — M542 Cervicalgia: Secondary | ICD-10-CM

## 2021-09-03 LAB — LIPID PANEL
Cholesterol: 218 mg/dL — ABNORMAL HIGH (ref 0–200)
HDL: 42 mg/dL (ref >39.00)
NonHDL: 175.66
Total CHOL/HDL Ratio: 5
Triglycerides: 227 mg/dL — ABNORMAL HIGH (ref 0.0–149.0)
VLDL: 45.4 mg/dL — ABNORMAL HIGH (ref 0.0–40.0)

## 2021-09-03 LAB — CBC WITH DIFFERENTIAL/PLATELET
Basophils Absolute: 0 10*3/uL (ref 0.0–0.1)
Basophils Relative: 0.3 % (ref 0.0–3.0)
Eosinophils Absolute: 0.2 10*3/uL (ref 0.0–0.7)
Eosinophils Relative: 2.3 % (ref 0.0–5.0)
HCT: 38.9 % (ref 36.0–46.0)
Hemoglobin: 13 g/dL (ref 12.0–15.0)
Lymphocytes Relative: 26.1 % (ref 12.0–46.0)
Lymphs Abs: 1.7 10*3/uL (ref 0.7–4.0)
MCHC: 33.5 g/dL (ref 30.0–36.0)
MCV: 92.9 fl (ref 78.0–100.0)
Monocytes Absolute: 0.5 10*3/uL (ref 0.1–1.0)
Monocytes Relative: 7.8 % (ref 3.0–12.0)
Neutro Abs: 4.2 10*3/uL (ref 1.4–7.7)
Neutrophils Relative %: 63.5 % (ref 43.0–77.0)
Platelets: 239 10*3/uL (ref 150.0–400.0)
RBC: 4.19 Mil/uL (ref 3.87–5.11)
RDW: 12.8 % (ref 11.5–15.5)
WBC: 6.6 10*3/uL (ref 4.0–10.5)

## 2021-09-03 LAB — COMPREHENSIVE METABOLIC PANEL
ALT: 37 U/L — ABNORMAL HIGH (ref 0–35)
AST: 30 U/L (ref 0–37)
Albumin: 4.3 g/dL (ref 3.5–5.2)
Alkaline Phosphatase: 66 U/L (ref 39–117)
BUN: 13 mg/dL (ref 6–23)
CO2: 34 mEq/L — ABNORMAL HIGH (ref 19–32)
Calcium: 9.9 mg/dL (ref 8.4–10.5)
Chloride: 102 mEq/L (ref 96–112)
Creatinine, Ser: 0.76 mg/dL (ref 0.40–1.20)
GFR: 86.22 mL/min (ref >60.00)
Glucose, Bld: 97 mg/dL (ref 70–99)
Potassium: 4.2 mEq/L (ref 3.5–5.1)
Sodium: 139 mEq/L (ref 135–145)
Total Bilirubin: 1.2 mg/dL (ref 0.2–1.2)
Total Protein: 7.1 g/dL (ref 6.0–8.3)

## 2021-09-03 LAB — LDL CHOLESTEROL, DIRECT: Direct LDL: 148 mg/dL

## 2021-09-03 LAB — HEMOGLOBIN A1C: Hgb A1c MFr Bld: 5.8 % (ref 4.6–6.5)

## 2021-09-03 MED ORDER — METHYLPREDNISOLONE ACETATE 40 MG/ML IJ SUSP
40.0000 mg | Freq: Once | INTRAMUSCULAR | Status: AC
  Administered 2021-09-03: 09:00:00 40 mg via INTRAMUSCULAR

## 2021-09-03 MED ORDER — CYCLOBENZAPRINE HCL 5 MG PO TABS
5.0000 mg | ORAL_TABLET | Freq: Two times a day (BID) | ORAL | 0 refills | Status: DC | PRN
Start: 2021-09-03 — End: 2021-10-15

## 2021-09-03 NOTE — Assessment & Plan Note (Signed)
Discussed age-appropriate screenings and immunizations and encouraged healthy lifestyle.

## 2021-09-03 NOTE — Progress Notes (Signed)
Established Patient Office Visit  Subjective:  Patient ID: Katie Townsend, female    DOB: 02/24/1961  Age: 60 y.o. MRN: [id number]  CC:  Chief Complaint  Patient presents with   Annual Exam    HPI Katie Townsend presents for complete physical and follow up of chronic conditions.  Immunizations: -Tetanus: 04/27/2013 -Influenza: 05/12/2021 -Covid-19: 05/07/2020, 05/29/2020 -Shingles: Information given during visit and at discharge -Pneumonia: NA  -HPV: Aged out  Diet: [place]. States as of late she has been more hungry. Normally e meals a day and a snack like an apple. Does drink water, vitamin water, and coffee with cream and sugar Exercise:  Doing about 30 mins of stationary bike up to 4-7 times a week  Eye exam: Completes annually.  Saw them last year. Normally yearly Dental exam: Completes semi-annually   Pap Smear: Hysterectomy Mammogram: Completed in 05/29/2021 Dexa: NA Colonoscopy: Completed in 2012. Was cleared for procedure by cardiology. Patient states she is frustrated with the GI group   Lung Cancer Screening: Smoked when she was 17 for approx 1.5 years. Does not meet requirements  Right heel pain: Started approx 1 month ago. States that when she is standing then sits down she feels it does not hurt with standing. Burning pain and intermittent. Has not tried any medications. She has been for back but unsure if it has helped  Neck pain: Have discussed this before. She thinks this is part of her inability to sleep as the neck discomfort can wake her up. She is able to go to sleep just not stay asleep after she has been awaken. She has not seen ortho in regards to the neck yet.  Back/sciatica: Has been flared up for a couple months. Standing still makes it worse. Intermittent. Ibuprofen Past Medical History:  Diagnosis Date   Arthritis    Depression    GERD (gastroesophageal reflux disease)     Past Surgical History:  Procedure Laterality Date   ABDOMINAL  HYSTERECTOMY  09/07/2007   Partial left ovaries   APPENDECTOMY  09/06/1976   COLONOSCOPY     EYE SURGERY Right 09/06/1974    Family History  Problem Relation Age of Onset   Arthritis Mother    Hyperlipidemia Mother    Heart disease Mother    Hypertension Mother    Anxiety disorder Mother    Diabetes Mother    Alcohol abuse Father    Arthritis Father    Mental illness Father    Colon polyps Sister    Diabetes Sister    Diabetes Brother    Diabetes Maternal Aunt    Colon cancer Neg Hx    Esophageal cancer Neg Hx    Rectal cancer Neg Hx    Stomach cancer Neg Hx     Social History   Socioeconomic History   Marital status: Married    Spouse name: Not on file   Number of children: Not on file   Years of education: Not on file   Highest education level: Not on file  Occupational History   Not on file  Tobacco Use   Smoking status: Never   Smokeless tobacco: Never  Vaping Use   Vaping Use: Never used  Substance and Sexual Activity   Alcohol use: No    Alcohol/week: 0.0 standard drinks   Drug use: No   Sexual activity: Yes    Birth control/protection: Condom, Surgical  Other Topics Concern   Not on file  Social History Narrative   Not  on file   Social Determinants of Health   Financial Resource Strain: Not on file  Food Insecurity: Not on file  Transportation Needs: Not on file  Physical Activity: Not on file  Stress: Not on file  Social Connections: Not on file  Intimate Partner Violence: Not on file    Outpatient Medications Prior to Visit  Medication Sig Dispense Refill   esomeprazole (NEXIUM) 20 MG capsule Take 1 capsule shortly before breakfast meal.     famotidine (PEPCID) 20 MG tablet Take 1 tablet daily at bedtime as needed.     hydrOXYzine (VISTARIL) 25 MG capsule 25mg -50mg  every evening at bedtime 45 capsule 2   Multiple Vitamins-Minerals (MULTIPLE VITAMINS/WOMENS PO) Take by mouth.     vitamin B-12 (CYANOCOBALAMIN) 100 MCG tablet Take 100 mcg  by mouth daily.     VITAMIN D, CHOLECALCIFEROL, PO Take by mouth.     eszopiclone (LUNESTA) 2 MG TABS tablet Take 1 tablet (2 mg total) by mouth at bedtime as needed for sleep. Take immediately before bedtime (Patient not taking: No sig reported) 20 tablet 0   Polyethylene Glycol 3350 (MIRALAX PO) Take by mouth. Colonoscopy prep (Patient not taking: Reported on 06/22/2021)     No facility-administered medications prior to visit.    Allergies  Allergen Reactions   Codeine Nausea Only    ROS Review of Systems  Constitutional:  Positive for fatigue. Negative for chills and fever.  Respiratory:  Negative for cough and shortness of breath.   Cardiovascular:  Negative for chest pain, palpitations and leg swelling.  Gastrointestinal:  Negative for abdominal pain, diarrhea, nausea and vomiting.       Once a day with harder to softer stool  Genitourinary:  Negative for dysuria, hematuria, vaginal bleeding, vaginal discharge and vaginal pain.       Nocturia x 1 intermittent  Musculoskeletal:  Positive for back pain and neck pain.  Neurological:  Negative for dizziness, weakness, light-headedness, numbness and headaches.  Psychiatric/Behavioral:  Negative for hallucinations and suicidal ideas.      Objective:    Physical Exam Constitutional:      Appearance: Normal appearance.  HENT:     Right Ear: Tympanic membrane, ear canal and external ear normal. There is no impacted cerumen.     Left Ear: Tympanic membrane, ear canal and external ear normal. There is no impacted cerumen.  Eyes:     Extraocular Movements: Extraocular movements intact.     Pupils: Pupils are equal, round, and reactive to light.     Comments: Wears corrective lenses  Cardiovascular:     Rate and Rhythm: Normal rate and regular rhythm.     Pulses: Normal pulses.     Heart sounds: Normal heart sounds.  Pulmonary:     Effort: Pulmonary effort is normal.     Breath sounds: Normal breath sounds.  Abdominal:      General: Bowel sounds are normal. There is no distension.     Palpations: There is no mass.     Tenderness: There is no abdominal tenderness.  Musculoskeletal:     Cervical back: Tenderness present.     Lumbar back: No tenderness or bony tenderness. Positive left straight leg raise test. Negative right straight leg raise test.     Right lower leg: No edema.     Left lower leg: No edema.     Comments: Mucsle tightness on bilateral paraspinal areas  Lymphadenopathy:     Cervical: No cervical adenopathy.  Skin:  General: Skin is warm.  Neurological:     General: No focal deficit present.     Mental Status: She is alert.  Psychiatric:        Mood and Affect: Mood normal.        Behavior: Behavior normal.        Thought Content: Thought content normal.        Judgment: Judgment normal.    BP 130/82    Pulse 70    Temp (!) 96.8 F (36 C)    Resp 12    Ht 5' 8.5" (1.74 m)    Wt 204 lb 2 oz (92.6 kg)    SpO2 98%    BMI 30.59 kg/m  Wt Readings from Last 3 Encounters:  09/03/21 204 lb 2 oz (92.6 kg)  06/22/21 204 lb 8 oz (92.8 kg)  06/09/21 204 lb (92.5 kg)     Health Maintenance Due  Topic Date Due   Pneumococcal Vaccine 47-76 Years old (1 - PCV) Never done   Zoster Vaccines- Shingrix (1 of 2) Never done   COVID-19 Vaccine (3 - Booster for Pfizer series) 07/24/2020   COLONOSCOPY (Pts 45-47yrs Insurance coverage will need to be confirmed)  11/24/2020    There are no preventive care reminders to display for this patient.  Lab Results  Component Value Date   TSH 1.29 12/31/2015   Lab Results  Component Value Date   WBC 8.8 05/07/2021   HGB 13.1 05/07/2021   HCT 39.0 05/07/2021   MCV 95.1 05/07/2021   PLT 265 05/07/2021   Lab Results  Component Value Date   NA 139 05/07/2021   K 3.6 05/07/2021   CO2 29 05/07/2021   GLUCOSE 105 (H) 05/07/2021   BUN 11 05/07/2021   CREATININE 0.88 05/07/2021   BILITOT 0.8 09/02/2020   ALKPHOS 63 09/02/2020   AST 21 09/02/2020    ALT 27 09/02/2020   PROT 7.2 09/02/2020   ALBUMIN 4.6 09/02/2020   CALCIUM 9.6 05/07/2021   ANIONGAP 8 05/07/2021   GFR 89.65 09/02/2020   Lab Results  Component Value Date   CHOL 215 (H) 09/02/2020   Lab Results  Component Value Date   HDL 47.90 09/02/2020   Lab Results  Component Value Date   LDLCALC 142 (H) 09/02/2020   Lab Results  Component Value Date   TRIG 125.0 09/02/2020   Lab Results  Component Value Date   CHOLHDL 4 09/02/2020   Lab Results  Component Value Date   HGBA1C 5.8 09/02/2020      Assessment & Plan:   Problem List Items Addressed This Visit       Nervous and Auditory   Sciatica of left side - Primary    Has been flaring up as of late we will give Depo-Medrol 40 mg IM in office.  Continue to monitor      Relevant Medications   cyclobenzaprine (FLEXERIL) 5 MG tablet     Other   Trouble in sleeping    States that hydroxyzine does help.  She has no trouble going to sleep but will be awakened in the night and have trouble falling back asleep.  States is due to similar neck pain.  Did discuss not taking Flexeril with hydroxyzine patient knowledge understood.  Continue to monitor      Neck pain    Ongoing issue.  Did do plain films of neck earlier in the year.  She has not followed by orthopedics.  We will hold  off for now.  We will try muscle relaxer as her muscles were tight on exam.      Relevant Medications   cyclobenzaprine (FLEXERIL) 5 MG tablet   Preventative health care    Discussed age-appropriate screenings and immunizations and encouraged healthy lifestyle.      Relevant Orders   CBC with Differential/Platelet (Completed)   Comprehensive metabolic panel (Completed)   Hemoglobin A1c (Completed)   Lipid panel (Completed)    No orders of the defined types were placed in this encounter.   Follow-up: No follow-ups on file.   This visit occurred during the SARS-CoV-2 public health emergency.  Safety protocols were in place,  including screening questions prior to the visit, additional usage of staff PPE, and extensive cleaning of exam room while observing appropriate contact time as indicated for disinfecting solutions.   Romilda Garret, NP

## 2021-09-03 NOTE — Patient Instructions (Signed)
Nice to see you today I will be in touch in regards to the lab results I have attached information on the shingles vaccine (Shingrix). We have it and big box pharmacies also have it Follow up with me in one year, sooner if needed

## 2021-09-03 NOTE — Assessment & Plan Note (Signed)
Ongoing issue.  Did do plain films of neck earlier in the year.  She has not followed by orthopedics.  We will hold off for now.  We will try muscle relaxer as her muscles were tight on exam.

## 2021-09-03 NOTE — Assessment & Plan Note (Signed)
Has been flaring up as of late we will give Depo-Medrol 40 mg IM in office.  Continue to monitor

## 2021-09-03 NOTE — Assessment & Plan Note (Signed)
States that hydroxyzine does help.  She has no trouble going to sleep but will be awakened in the night and have trouble falling back asleep.  States is due to similar neck pain.  Did discuss not taking Flexeril with hydroxyzine patient knowledge understood.  Continue to monitor

## 2021-10-15 ENCOUNTER — Other Ambulatory Visit: Payer: Self-pay | Admitting: Nurse Practitioner

## 2021-10-15 DIAGNOSIS — M542 Cervicalgia: Secondary | ICD-10-CM

## 2021-10-16 MED ORDER — CYCLOBENZAPRINE HCL 5 MG PO TABS: 5.0000 mg | ORAL_TABLET | Freq: Two times a day (BID) | ORAL | 0 refills | Status: DC | PRN

## 2021-11-06 ENCOUNTER — Ambulatory Visit (INDEPENDENT_AMBULATORY_CARE_PROVIDER_SITE_OTHER): Payer: 59 | Admitting: Nurse Practitioner

## 2021-11-06 ENCOUNTER — Other Ambulatory Visit: Payer: Self-pay

## 2021-11-06 ENCOUNTER — Encounter: Payer: Self-pay | Admitting: Nurse Practitioner

## 2021-11-06 VITALS — BP 130/62 | HR 67 | Temp 96.9°F | Resp 10 | Ht 68.5 in | Wt 188.1 lb

## 2021-11-06 DIAGNOSIS — L989 Disorder of the skin and subcutaneous tissue, unspecified: Secondary | ICD-10-CM

## 2021-11-06 DIAGNOSIS — G479 Sleep disorder, unspecified: Secondary | ICD-10-CM

## 2021-11-06 DIAGNOSIS — R0781 Pleurodynia: Secondary | ICD-10-CM

## 2021-11-06 MED ORDER — HYDROXYZINE PAMOATE 50 MG PO CAPS: 50.0000 mg | ORAL_CAPSULE | Freq: Every evening | ORAL | 1 refills | Status: DC | PRN

## 2021-11-06 MED ORDER — KETOROLAC TROMETHAMINE 30 MG/ML IJ SOLN
30.0000 mg | Freq: Once | INTRAMUSCULAR | Status: AC
  Administered 2021-11-06: 30 mg via INTRAMUSCULAR

## 2021-11-06 NOTE — Assessment & Plan Note (Signed)
Patient was placed on hydroxyzine 25 mg 1 to 2 tablets at bedtime as needed.  Patient reports she has had good response to taking 50 mg of hydroxyzine.  We will change patient's prescription from 25 mg to 50 mg hydroxyzine patient can take 1 tablet at bedtime as needed.  Did discuss the doses change with patient at bedside. ?

## 2021-11-06 NOTE — Assessment & Plan Note (Signed)
Low likelihood for PE given Wells score.  Patient states ibuprofen is beneficial and cannot elicit it with housework.  Will administer Toradol 30 mg IM x1 dose in office.  Avoid NSAIDs for next 12 hours.  Follow-up if no improvement ?

## 2021-11-06 NOTE — Patient Instructions (Signed)
Nice to see you today ?I sent in a referral to the skin doctor ?I refilled the hydroxyzine for 50mg  capsules. You only need to take ONE CAPSULE at bedtime now ?Avoid Nsaids like ibuprofen and aleve for 12 hours ?Follow up if no improvement  ?

## 2021-11-06 NOTE — Assessment & Plan Note (Signed)
Skin lesion to right posterior lower extremity.  Patient was thinking it was a SK.  Upon further evaluation I do not believe it is an SK.  Did discuss this with patient and recommend follow-up with dermatology.  Patient is seen dermatology in the past but has been many years.  No history of skin cancer.  Ambulatory referral placed in office today. ?

## 2021-11-06 NOTE — Progress Notes (Signed)
Acute Office Visit  Subjective:    Patient ID: Katie Townsend, female    DOB: [birth year], 61 y.o.   MRN: [id number]  Chief Complaint  Patient presents with   Back Pain    X 2 months, most days, pain is located in the left shoulder blade area. When she lays down she can feel the pain with taking breathes in.    Skin Problem    Has a spot on the back of right leg/calf area, thinks maybe its a SK- and the area gets caught on clothes and bleeds off and on     Patient is in today for   Back pain: States it started approx in Jan. Right sided . No injury. No history of blood clot. Underneath/besides the shoulder blade. States that she has jared herslef a couple times and can cause back pain. States that the ibuprofen helped but muscle relaxer does not help. States that doing house work makes it worse. And deep breaths make it worse. No recent long travel   Skin issues: To the back of her right posterior right leg. States that it was there and got caught and came off. States a couple months ago it came off. She has noticed it coming back. States that it does bleed with towel or clothes.  Medication refill: States that we did start hydroxzine and will get 7-8 hours of sleep with out the medicatoin she will get 4 approx 4 hours of sleep  Past Medical History:  Diagnosis Date   Arthritis    Depression    GERD (gastroesophageal reflux disease)     Past Surgical History:  Procedure Laterality Date   ABDOMINAL HYSTERECTOMY  09/07/2007   Partial left ovaries   APPENDECTOMY  09/06/1976   COLONOSCOPY     EYE SURGERY Right 09/06/1974    Family History  Problem Relation Age of Onset   Arthritis Mother    Hyperlipidemia Mother    Heart disease Mother    Hypertension Mother    Anxiety disorder Mother    Diabetes Mother    Alcohol abuse Father    Arthritis Father    Mental illness Father    Colon polyps Sister    Diabetes Sister    Diabetes Brother    Diabetes Maternal Aunt    Colon  cancer Neg Hx    Esophageal cancer Neg Hx    Rectal cancer Neg Hx    Stomach cancer Neg Hx     Social History   Socioeconomic History   Marital status: Married    Spouse name: Not on file   Number of children: Not on file   Years of education: Not on file   Highest education level: Not on file  Occupational History   Not on file  Tobacco Use   Smoking status: Never   Smokeless tobacco: Never  Vaping Use   Vaping Use: Never used  Substance and Sexual Activity   Alcohol use: No    Alcohol/week: 0.0 standard drinks   Drug use: No   Sexual activity: Yes    Birth control/protection: Condom, Surgical  Other Topics Concern   Not on file  Social History Narrative   Not on file   Social Determinants of Health   Financial Resource Strain: Not on file  Food Insecurity: Not on file  Transportation Needs: Not on file  Physical Activity: Not on file  Stress: Not on file  Social Connections: Not on file  Intimate Partner Violence: Not on  file    Outpatient Medications Prior to Visit  Medication Sig Dispense Refill   cyclobenzaprine (FLEXERIL) 5 MG tablet Take 1 tablet (5 mg total) by mouth 2 (two) times daily as needed for muscle spasms. 30 tablet 0   esomeprazole (NEXIUM) 20 MG capsule Take 1 capsule shortly before breakfast meal.     famotidine (PEPCID) 20 MG tablet Take 1 tablet daily at bedtime as needed.     Multiple Vitamins-Minerals (MULTIPLE VITAMINS/WOMENS PO) Take by mouth.     vitamin B-12 (CYANOCOBALAMIN) 100 MCG tablet Take 100 mcg by mouth daily.     VITAMIN D, CHOLECALCIFEROL, PO Take by mouth.     hydrOXYzine (VISTARIL) 25 MG capsule 25mg -50mg  every evening at bedtime 45 capsule 2   No facility-administered medications prior to visit.    Allergies  Allergen Reactions   Codeine Nausea Only    Review of Systems  Constitutional:  Negative for chills and fever.  Respiratory:  Positive for cough (intermittent and improving since covid 19). Negative for  shortness of breath.   Cardiovascular:  Negative for chest pain.  Gastrointestinal:  Negative for diarrhea, nausea and vomiting.  Musculoskeletal:  Positive for back pain.  Skin:  Positive for rash.  Neurological:  Negative for dizziness, weakness, light-headedness, numbness and headaches.      Objective:    Physical Exam Constitutional:      Appearance: Normal appearance.  Cardiovascular:     Rate and Rhythm: Normal rate and regular rhythm.     Heart sounds: Normal heart sounds.  Pulmonary:     Effort: Pulmonary effort is normal.     Breath sounds: Normal breath sounds.  Musculoskeletal:       Arms:     Thoracic back: No signs of trauma, tenderness or bony tenderness.  Skin:    General: Skin is warm.     Findings: Lesion present.       Neurological:     Mental Status: She is alert.     BP 130/62    Pulse 67    Temp (!) 96.9 F (36.1 C)    Resp 10    Ht 5' 8.5" (1.74 m)    Wt 188 lb 2 oz (85.3 kg)    SpO2 97%    BMI 28.19 kg/m  Wt Readings from Last 3 Encounters:  11/06/21 188 lb 2 oz (85.3 kg)  09/03/21 204 lb 2 oz (92.6 kg)  06/22/21 204 lb 8 oz (92.8 kg)    Health Maintenance Due  Topic Date Due   Zoster Vaccines- Shingrix (1 of 2) Never done   COVID-19 Vaccine (3 - Booster for Pfizer series) 07/24/2020   COLONOSCOPY (Pts 45-54yrs Insurance coverage will need to be confirmed)  11/24/2020    There are no preventive care reminders to display for this patient.   Lab Results  Component Value Date   TSH 1.29 12/31/2015   Lab Results  Component Value Date   WBC 6.6 09/03/2021   HGB 13.0 09/03/2021   HCT 38.9 09/03/2021   MCV 92.9 09/03/2021   PLT 239.0 09/03/2021   Lab Results  Component Value Date   NA 139 09/03/2021   K 4.2 09/03/2021   CO2 34 (H) 09/03/2021   GLUCOSE 97 09/03/2021   BUN 13 09/03/2021   CREATININE 0.76 09/03/2021   BILITOT 1.2 09/03/2021   ALKPHOS 66 09/03/2021   AST 30 09/03/2021   ALT 37 (H) 09/03/2021   PROT 7.1 09/03/2021    ALBUMIN 4.3 09/03/2021  CALCIUM 9.9 09/03/2021   ANIONGAP 8 05/07/2021   GFR 86.22 09/03/2021   Lab Results  Component Value Date   CHOL 218 (H) 09/03/2021   Lab Results  Component Value Date   HDL 42.00 09/03/2021   Lab Results  Component Value Date   LDLCALC 142 (H) 09/02/2020   Lab Results  Component Value Date   TRIG 227.0 (H) 09/03/2021   Lab Results  Component Value Date   CHOLHDL 5 09/03/2021   Lab Results  Component Value Date   HGBA1C 5.8 09/03/2021       Assessment & Plan:   Problem List Items Addressed This Visit       Musculoskeletal and Integument   Skin lesion - Primary    Skin lesion to right posterior lower extremity.  Patient was thinking it was a SK.  Upon further evaluation I do not believe it is an SK.  Did discuss this with patient and recommend follow-up with dermatology.  Patient is seen dermatology in the past but has been many years.  No history of skin cancer.  Ambulatory referral placed in office today.      Relevant Orders   Ambulatory referral to Dermatology     Other   Trouble in sleeping    Patient was placed on hydroxyzine 25 mg 1 to 2 tablets at bedtime as needed.  Patient reports she has had good response to taking 50 mg of hydroxyzine.  We will change patient's prescription from 25 mg to 50 mg hydroxyzine patient can take 1 tablet at bedtime as needed.  Did discuss the doses change with patient at bedside.      Relevant Medications   hydrOXYzine (VISTARIL) 50 MG capsule   Rib pain    Low likelihood for PE given Wells score.  Patient states ibuprofen is beneficial and cannot elicit it with housework.  Will administer Toradol 30 mg IM x1 dose in office.  Avoid NSAIDs for next 12 hours.  Follow-up if no improvement        Meds ordered this encounter  Medications   ketorolac (TORADOL) 30 MG/ML injection 30 mg   hydrOXYzine (VISTARIL) 50 MG capsule    Sig: Take 1 capsule (50 mg total) by mouth at bedtime as needed.     Dispense:  90 capsule    Refill:  1    Order Specific Question:   Supervising Provider    Answer:   Loura Pardon A [[id number]   This visit occurred during the SARS-CoV-2 public health emergency.  Safety protocols were in place, including screening questions prior to the visit, additional usage of staff PPE, and extensive cleaning of exam room while observing appropriate contact time as indicated for disinfecting solutions.    Romilda Garret, NP

## 2021-11-12 ENCOUNTER — Encounter: Payer: Self-pay | Admitting: *Deleted

## 2021-11-13 ENCOUNTER — Ambulatory Visit: Payer: 59 | Admitting: Nurse Practitioner

## 2022-03-14 ENCOUNTER — Encounter (HOSPITAL_COMMUNITY): Payer: Self-pay | Admitting: *Deleted

## 2022-03-14 ENCOUNTER — Other Ambulatory Visit: Payer: Self-pay

## 2022-03-14 ENCOUNTER — Ambulatory Visit (INDEPENDENT_AMBULATORY_CARE_PROVIDER_SITE_OTHER): Payer: 59

## 2022-03-14 ENCOUNTER — Ambulatory Visit (HOSPITAL_COMMUNITY)
Admission: EM | Admit: 2022-03-14 | Discharge: 2022-03-14 | Disposition: A | Discharge status: Home / Self Care | Payer: 59 | Attending: Emergency Medicine | Admitting: Emergency Medicine

## 2022-03-14 DIAGNOSIS — M79671 Pain in right foot: Secondary | ICD-10-CM

## 2022-03-14 NOTE — ED Provider Notes (Signed)
[place]    CSN: [id number] Arrival date & time: 03/14/22  1629      History   Chief Complaint Chief Complaint  Patient presents with   Foot Pain    Want to check for possible stress fracture - Entered by patient    HPI Katie Townsend is a 61 y.o. female.   Patient presents with right heel pain for 2 weeks.  Pain is intermittent, described as a burning sensation, rated at 8 out of 10.  Worsened by standing and walking.  Endorses a stiffness to the ball of the right foot 2 weeks.  Symptoms are worsening.  Has attempted rolling the bottom of the foot with ice and using over-the-counter ibuprofen which has been somewhat helpful.  Currently dealing with plantar fasciitis of the right foot, followed by podiatry.  Endorses that she lives a very active lifestyle and has been working out despite pain.  Denies numbness or tingling.  Concern for stress fracture.   Past Medical History:  Diagnosis Date   Arthritis    Depression    GERD (gastroesophageal reflux disease)     Patient Active Problem List   Diagnosis Date Noted   Skin lesion 11/06/2021   Rib pain 11/06/2021   Sciatica of left side 09/03/2021   Preventative health care 09/03/2021   Neck pain 06/01/2021   Joint pain 06/01/2021   Chest discomfort 06/01/2021   Chronic midline low back pain without sciatica 07/14/2018   Trouble in sleeping 02/18/2018   GERD (gastroesophageal reflux disease) 08/21/2014   Anxiety 08/21/2014    Past Surgical History:  Procedure Laterality Date   ABDOMINAL HYSTERECTOMY  09/07/2007   Partial left ovaries   APPENDECTOMY  09/06/1976   COLONOSCOPY     EYE SURGERY Right 09/06/1974    OB History   No obstetric history on file.      Home Medications    Prior to Admission medications   Medication Sig Start Date End Date Taking? Authorizing Provider  cyclobenzaprine (FLEXERIL) 5 MG tablet Take 1 tablet (5 mg total) by mouth 2 (two) times daily as needed for muscle spasms.  10/16/21   Michela Pitcher, NP  esomeprazole (NEXIUM) 20 MG capsule Take 1 capsule shortly before breakfast meal. 01/01/20   Milus Banister, MD  famotidine (PEPCID) 20 MG tablet Take 1 tablet daily at bedtime as needed. 01/01/20   Milus Banister, MD  hydrOXYzine (VISTARIL) 50 MG capsule Take 1 capsule (50 mg total) by mouth at bedtime as needed. 11/06/21   Michela Pitcher, NP  Multiple Vitamins-Minerals (MULTIPLE VITAMINS/WOMENS PO) Take by mouth.    [provider]  vitamin B-12 (CYANOCOBALAMIN) 100 MCG tablet Take 100 mcg by mouth daily.    [provider]  VITAMIN D, CHOLECALCIFEROL, PO Take by mouth.    [provider]    Family History Family History  Problem Relation Age of Onset   Arthritis Mother    Hyperlipidemia Mother    Heart disease Mother    Hypertension Mother    Anxiety disorder Mother    Diabetes Mother    Alcohol abuse Father    Arthritis Father    Mental illness Father    Colon polyps Sister    Diabetes Sister    Diabetes Brother    Diabetes Maternal Aunt    Colon cancer Neg Hx    Esophageal cancer Neg Hx    Rectal cancer Neg Hx    Stomach cancer Neg Hx  Social History Social History   Tobacco Use   Smoking status: Never   Smokeless tobacco: Never  Vaping Use   Vaping Use: Never used  Substance Use Topics   Alcohol use: No    Alcohol/week: 0.0 standard drinks of alcohol   Drug use: No     Allergies   Codeine   Review of Systems Review of Systems  Constitutional: Negative.   Respiratory: Negative.    Cardiovascular: Negative.   Musculoskeletal:  Positive for gait problem and myalgias. Negative for arthralgias, back pain, joint swelling, neck pain and neck stiffness.  Skin: Negative.      Physical Exam Triage Vital Signs ED Triage Vitals  Enc Vitals Group     BP 03/14/22 1728 133/82     Pulse Rate 03/14/22 1728 68     Resp 03/14/22 1728 18     Temp 03/14/22 1728 98.7 F (37.1 C)     Temp src --      SpO2  03/14/22 1728 97 %     Weight --      Height --      Head Circumference --      Peak Flow --      Pain Score 03/14/22 1726 8     Pain Loc --      Pain Edu? --      Excl. in [place]? --    No data found.  Updated Vital Signs BP 133/82   Pulse 68   Temp 98.7 F (37.1 C)   Resp 18   SpO2 97%   Visual Acuity Right Eye Distance:   Left Eye Distance:   Bilateral Distance:    Right Eye Near:   Left Eye Near:    Bilateral Near:     Physical Exam Constitutional:      Appearance: Normal appearance.  HENT:     Head: Normocephalic.  Eyes:     Extraocular Movements: Extraocular movements intact.  Feet:     Comments: Unable to reproduce tenderness along the heel of the right foot, mild tenderness noted to the lateral aspect of the foot without swelling, ecchymosis or deformity noted, able to bear weight, sensation intact, 2+ pedal pulse Neurological:     Mental Status: She is alert and oriented to person, place, and time. Mental status is at baseline.  Psychiatric:        Mood and Affect: Mood normal.        Behavior: Behavior normal.      UC Treatments / Results  Labs (all labs ordered are listed, but only abnormal results are displayed) Labs Reviewed - No data to display  EKG   Radiology DG Foot Complete Right  Result Date: 03/14/2022 CLINICAL DATA:  Heel pain. EXAM: RIGHT FOOT COMPLETE - 3+ VIEW COMPARISON:  None Available. FINDINGS: There is no evidence of fracture or dislocation. Joint spaces are maintained. There is a small plantar calcaneal spur. Soft tissues are unremarkable. IMPRESSION: Negative. Electronically Signed   By: Ronney Asters M.D.   On: 03/14/2022 17:59    Procedures Procedures (including critical care time)  Medications Ordered in UC Medications - No data to display  Initial Impression / Assessment and Plan / UC Course  I have reviewed the triage vital signs and the nursing notes.  Pertinent labs & imaging results that were available during my  care of the patient were reviewed by me and considered in my medical decision making (see chart for details).  Right foot pain  X-ray  of the foot negative, discussed findings with patient, etiology is most likely irritation to the tendon, discussed with patient, recommended increasing ibuprofen dose from 400 mg to 800 using consistently for 5 days then as needed, may continue ice and heat over the affected area pression elevation, recommended patient decrease exercise until pain has begun to settle, recommended follow-up with podiatry if symptoms persist or worsen Final Clinical Impressions(s) / UC Diagnoses   Final diagnoses:  None   Discharge Instructions   None    ED Prescriptions   None    PDMP not reviewed this encounter.   Hans Eden, [place] 03/17/22 [phone]

## 2022-03-14 NOTE — ED Triage Notes (Signed)
Pt reports Rt heel pain for a couple of weeks. Pt reports heel painis all the time.

## 2022-03-14 NOTE — Discharge Instructions (Signed)
Your x-ray today did not show injury to the bone, of your foot. Your pain is most likely being caused by irritation to the soft tissues, this should improve as time progresses.  Could also possibly be a ligament or tendon irritation due to your active lifestyle  Please increase your dose of ibuprofen from 400 mg to 800 mg (4 tab) every 8 hours for the next 5 days then you may reduce to normal dosage , this is to reduce the inflammatory process that occurs with injury  May elevate foot while sitting and lying to help reduce any swelling You may apply heat or ice, whichever makes you feel better, to affected area in 15 minute intervals  Please slightly reduce some of your physical activity to allow your foot time to rest and heal  May continue use of compression socks to add stability to support specifically when you are walking and standing  If your symptoms continue to persist or worse please follow-up with your podiatrist for reevaluation and further management

## 2022-06-03 ENCOUNTER — Other Ambulatory Visit: Payer: Self-pay | Admitting: Nurse Practitioner

## 2022-06-03 DIAGNOSIS — M542 Cervicalgia: Secondary | ICD-10-CM

## 2022-06-04 MED ORDER — CYCLOBENZAPRINE HCL 5 MG PO TABS: 5.0000 mg | ORAL_TABLET | Freq: Two times a day (BID) | ORAL | 0 refills | Status: DC | PRN

## 2022-06-04 NOTE — Telephone Encounter (Signed)
Patient has been scheduled

## 2022-06-04 NOTE — Telephone Encounter (Signed)
Please schedule CPE with [facility] for anytime after 09/03/22

## 2022-08-05 IMAGING — DX DG CERVICAL SPINE COMPLETE 4+V
6 series · 6 of 6 positions shown · non-contrast
Comparison: Chest x-ray 05/07/2021.

CLINICAL DATA: Neck pain.  History of herniated disc.

EXAM:
CERVICAL SPINE - COMPLETE 4+ VIEW

[cervical spine ap]
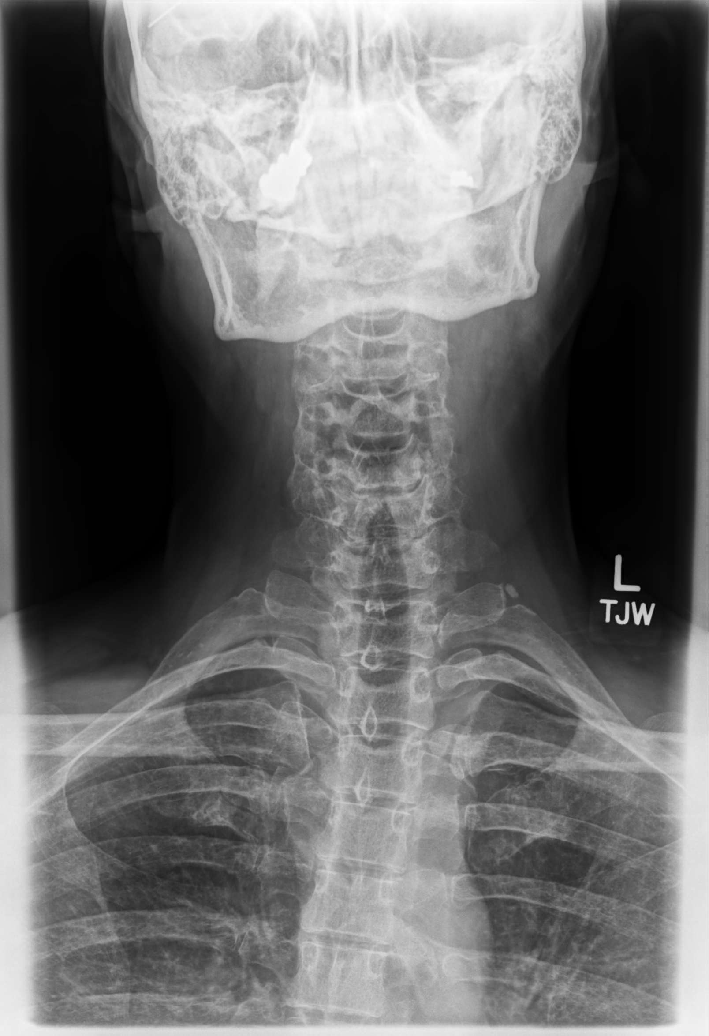

[cervical spine open mouth ap]
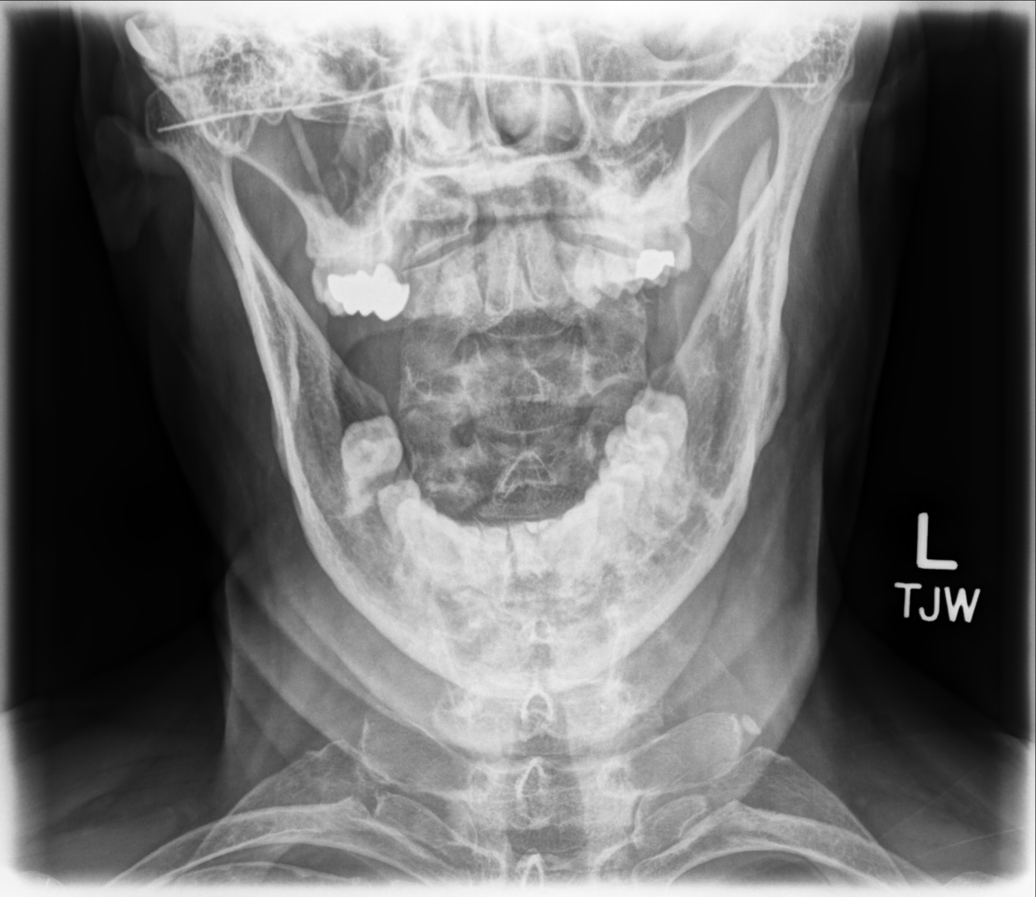

[cervico-thoracic (swimmers) lat]
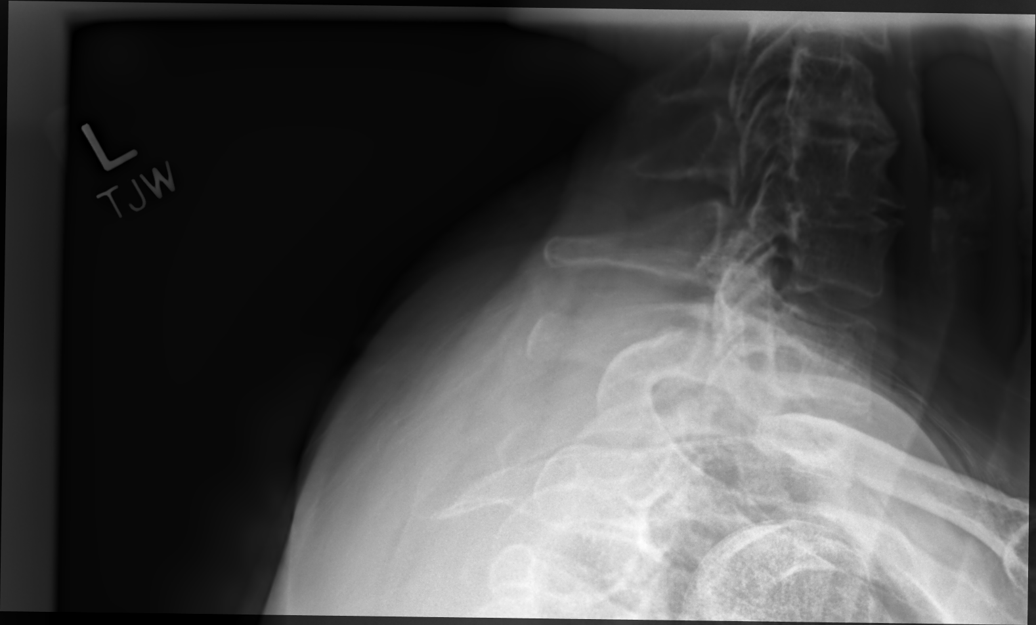

[cervical spine oblique (1 of 2)]
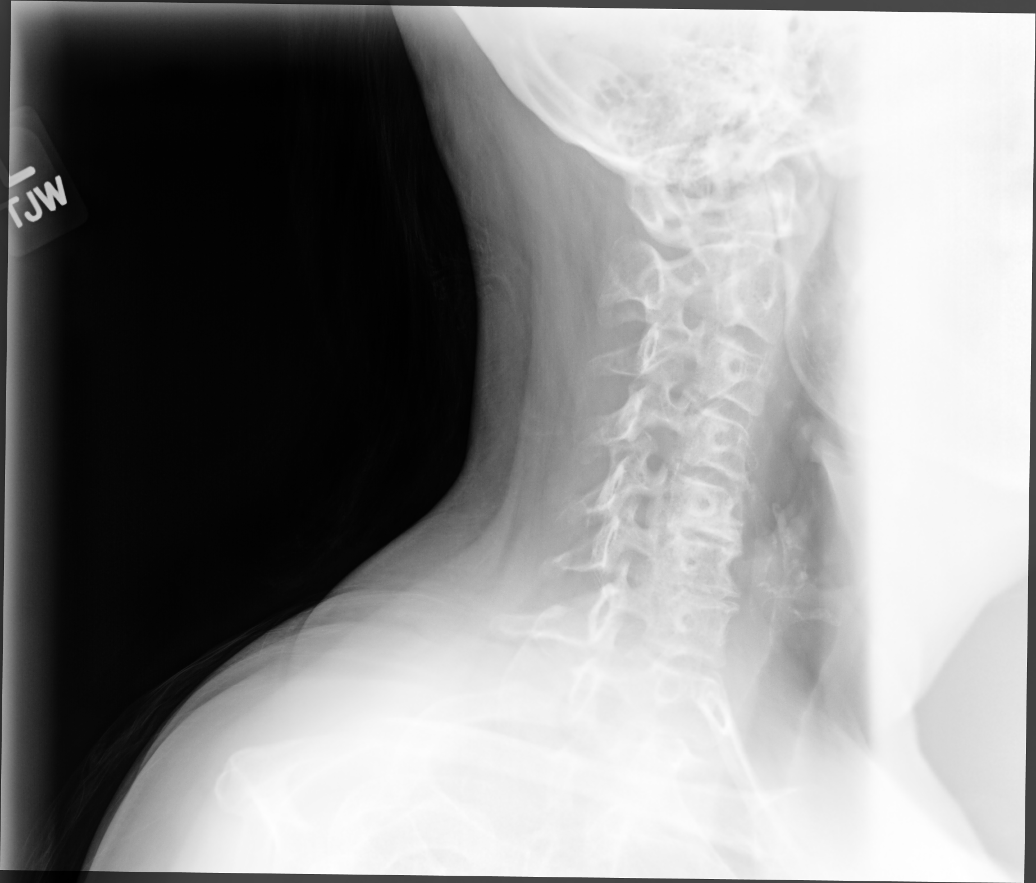

[cervical spine oblique (2 of 2)]
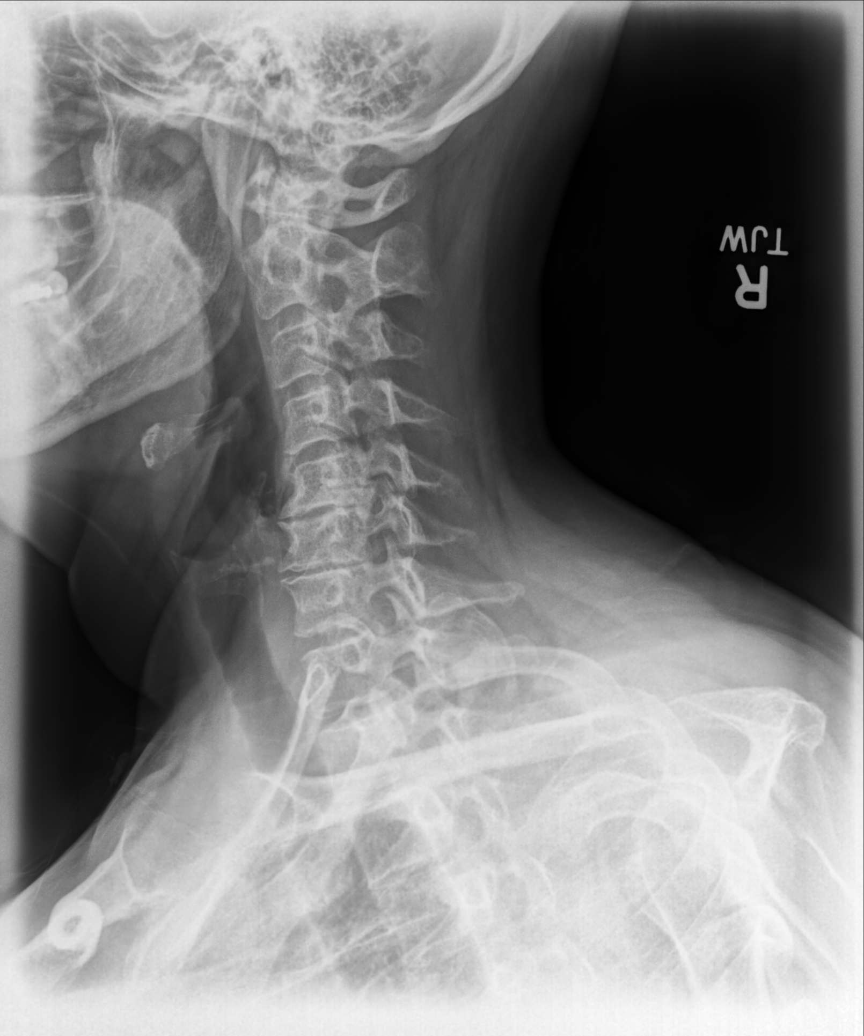

[cervical spine lat]
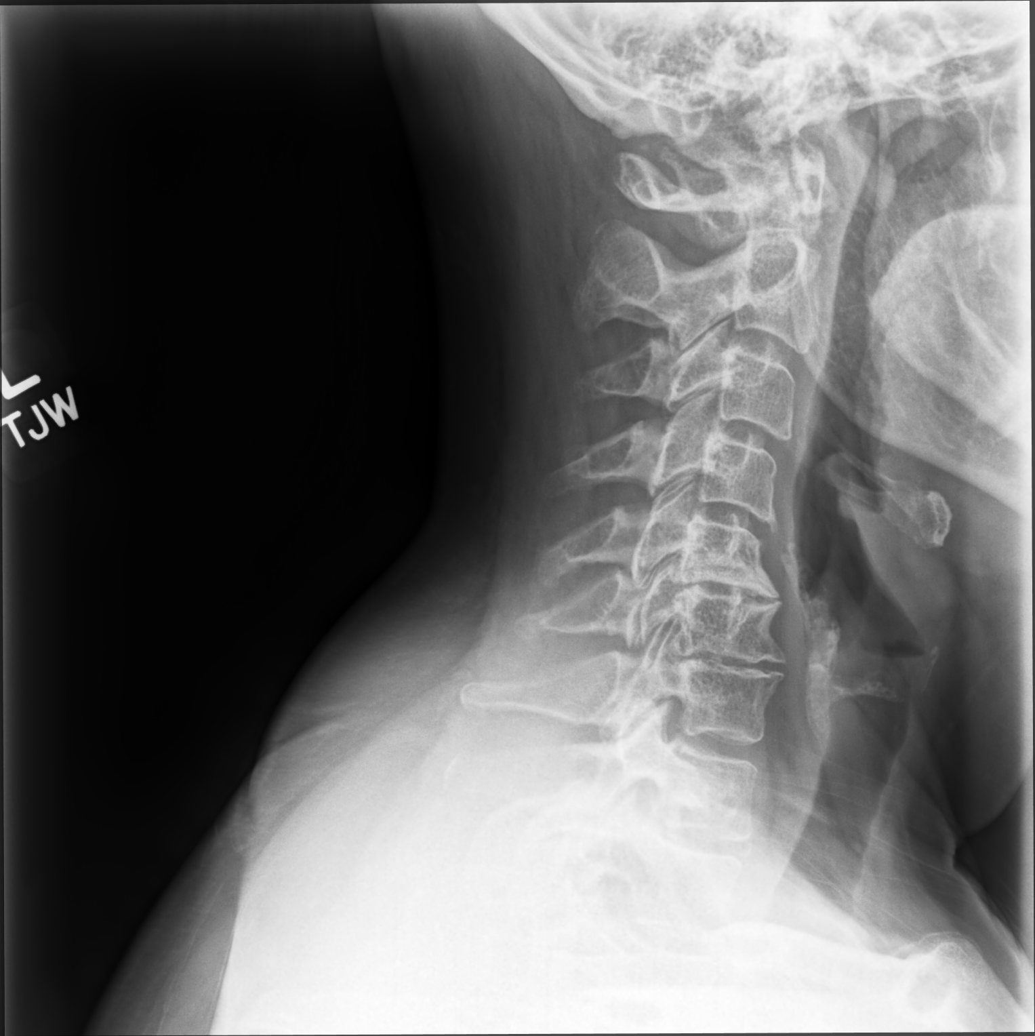

[6 of 6 positions shown; findings below may reference images not displayed]

FINDINGS: Loss of normal cervical lordosis. Diffuse severe multilevel
degenerative change with multilevel disc degeneration and endplate
osteophyte formation. Degenerative changes most prominent C4-C5,
C5-C6, C6-C7. Multilevel bilateral mild-to-moderate neural foraminal
narrowing. No acute bony abnormality. Pulmonary apices are clear.
IMPRESSION: Loss of normal cervical lordosis. Diffuse severe multilevel
degenerative change, most prominent at C4-C5, C5-C6, C6-C7.
Multilevel bilateral mild-to-moderate neural foraminal narrowing. No
acute bony abnormality.

## 2022-08-13 ENCOUNTER — Other Ambulatory Visit: Payer: Self-pay | Admitting: Nurse Practitioner

## 2022-08-13 DIAGNOSIS — M542 Cervicalgia: Secondary | ICD-10-CM

## 2022-08-13 MED ORDER — CYCLOBENZAPRINE HCL 5 MG PO TABS: 5.0000 mg | ORAL_TABLET | Freq: Two times a day (BID) | ORAL | 0 refills | Status: DC | PRN

## 2022-09-07 ENCOUNTER — Ambulatory Visit (INDEPENDENT_AMBULATORY_CARE_PROVIDER_SITE_OTHER)
Admission: RE | Admit: 2022-09-07 | Discharge: 2022-09-07 | Disposition: A | Discharge status: Home / Self Care | Payer: BC Managed Care – PPO | Source: Ambulatory Visit | Attending: Nurse Practitioner | Admitting: Nurse Practitioner

## 2022-09-07 ENCOUNTER — Ambulatory Visit (INDEPENDENT_AMBULATORY_CARE_PROVIDER_SITE_OTHER): Payer: BC Managed Care – PPO | Admitting: Nurse Practitioner

## 2022-09-07 ENCOUNTER — Encounter: Payer: Self-pay | Admitting: Nurse Practitioner

## 2022-09-07 VITALS — BP 122/78 | HR 63 | Ht 68.5 in | Wt 188.0 lb

## 2022-09-07 DIAGNOSIS — M545 Low back pain, unspecified: Secondary | ICD-10-CM

## 2022-09-07 DIAGNOSIS — Z0000 Encounter for general adult medical examination without abnormal findings: Secondary | ICD-10-CM

## 2022-09-07 DIAGNOSIS — R202 Paresthesia of skin: Secondary | ICD-10-CM | POA: Diagnosis not present

## 2022-09-07 DIAGNOSIS — E663 Overweight: Secondary | ICD-10-CM | POA: Diagnosis not present

## 2022-09-07 DIAGNOSIS — M542 Cervicalgia: Secondary | ICD-10-CM

## 2022-09-07 DIAGNOSIS — K219 Gastro-esophageal reflux disease without esophagitis: Secondary | ICD-10-CM | POA: Diagnosis not present

## 2022-09-07 DIAGNOSIS — G479 Sleep disorder, unspecified: Secondary | ICD-10-CM

## 2022-09-07 DIAGNOSIS — Z1231 Encounter for screening mammogram for malignant neoplasm of breast: Secondary | ICD-10-CM

## 2022-09-07 DIAGNOSIS — Z23 Encounter for immunization: Secondary | ICD-10-CM

## 2022-09-07 DIAGNOSIS — M47816 Spondylosis without myelopathy or radiculopathy, lumbar region: Secondary | ICD-10-CM | POA: Diagnosis not present

## 2022-09-07 LAB — COMPREHENSIVE METABOLIC PANEL
ALT: 14 U/L (ref 0–35)
AST: 16 U/L (ref 0–37)
Albumin: 4.3 g/dL (ref 3.5–5.2)
Alkaline Phosphatase: 58 U/L (ref 39–117)
BUN: 12 mg/dL (ref 6–23)
CO2: 32 mEq/L (ref 19–32)
Calcium: 9.6 mg/dL (ref 8.4–10.5)
Chloride: 103 mEq/L (ref 96–112)
Creatinine, Ser: 0.7 mg/dL (ref 0.40–1.20)
GFR: 94.49 mL/min (ref >60.00)
Glucose, Bld: 99 mg/dL (ref 70–99)
Potassium: 4.3 mEq/L (ref 3.5–5.1)
Sodium: 142 mEq/L (ref 135–145)
Total Bilirubin: 0.9 mg/dL (ref 0.2–1.2)
Total Protein: 6.6 g/dL (ref 6.0–8.3)

## 2022-09-07 LAB — HEMOGLOBIN A1C: Hgb A1c MFr Bld: 5.7 % (ref 4.6–6.5)

## 2022-09-07 LAB — LIPID PANEL
Cholesterol: 197 mg/dL (ref 0–200)
HDL: 46.2 mg/dL (ref >39.00)
LDL Cholesterol: 116 mg/dL — ABNORMAL HIGH (ref 0–99)
NonHDL: 151.19
Total CHOL/HDL Ratio: 4
Triglycerides: 176 mg/dL — ABNORMAL HIGH (ref 0.0–149.0)
VLDL: 35.2 mg/dL (ref 0.0–40.0)

## 2022-09-07 LAB — CBC
HCT: 37.7 % (ref 36.0–46.0)
Hemoglobin: 13 g/dL (ref 12.0–15.0)
MCHC: 34.5 g/dL (ref 30.0–36.0)
MCV: 93.1 fl (ref 78.0–100.0)
Platelets: 246 10*3/uL (ref 150.0–400.0)
RBC: 4.05 Mil/uL (ref 3.87–5.11)
RDW: 13.1 % (ref 11.5–15.5)
WBC: 6.2 10*3/uL (ref 4.0–10.5)

## 2022-09-07 LAB — TSH: TSH: 1.72 u[IU]/mL (ref 0.35–5.50)

## 2022-09-07 LAB — VITAMIN B12: Vitamin B-12: 400 pg/mL (ref 211–911)

## 2022-09-07 MED ORDER — CYCLOBENZAPRINE HCL 5 MG PO TABS: 5.0000 mg | ORAL_TABLET | Freq: Two times a day (BID) | ORAL | 0 refills | Status: DC | PRN

## 2022-09-07 MED ORDER — HYDROXYZINE PAMOATE 50 MG PO CAPS: 50.0000 mg | ORAL_CAPSULE | Freq: Every day | ORAL | 1 refills | Status: DC

## 2022-09-07 NOTE — Assessment & Plan Note (Signed)
States he is having some paresthesias to bilateral plantar surface of feet.  Feels like "bunched up socks".  Check B12 level today she does take over-the-counter oral B12

## 2022-09-07 NOTE — Assessment & Plan Note (Signed)
Will use Flexeril as needed.  Would like a refill, refill provided.  Will obtain lumbar picture, pending result

## 2022-09-07 NOTE — Assessment & Plan Note (Signed)
Discussed age-appropriate medications and screening exams.  Patient needs to follow-up with GI in regards for her CRC screening.  Mammogram order placed today.  Patient had hysterectomy does not do cervical cancer screening.  Patient was given information at discharge for preventative healthcare maintenance with anticipatory guidance for age range.

## 2022-09-07 NOTE — Assessment & Plan Note (Signed)
Will use Flexeril on a as needed basis.  Refill provided today

## 2022-09-07 NOTE — Progress Notes (Signed)
Established Patient Office Visit  Subjective   Patient ID: Katie Townsend, female    DOB: November 22, 1960  Age: 62 y.o. MRN: [id number]  Chief Complaint  Patient presents with   Annual Exam    Wants flu shot    HPI   GERD: States that it is well Nexium daily and pepecid as needed  Trouble in sleep: Patient was placed on hydroxyzine in the past.  We did titrate up to 50 mg for the medication and it was beneficial.  Patient states she did not refill it because it would not allow her to request refill through [facility].  In lieu of the medication she is using Benadryl nightly.  for complete physical and follow up of chronic conditions.  Immunizations: -Tetanus: 2014 -Influenza: up date today -Shingles:Discussed in office. -Pneumonia: Too young  -HPV: Aged out  Diet: [place]. 3 meals a day. With some snacks. She will drinks osme water, sweet tea (1/2), and coffee Exercise: No regular exercise. Eliptical 30 mins and will do 30 mins at night every day  Eye exam: Completes annually. Wears glasses. Retnia tear in August   Dental exam: Completes semi-annually   Pap Smear: Completed in  hysterectomy  Mammogram: Completed in 05/29/2021. [facility] in [place]   Colonoscopy: Completed in 2012, needs updating  Lung Cancer Screening: N/A Dexa: Too young    Sleep: States that she was using hydroxzyine. States that she ran out and was on '50mg'$    Review of Systems  Constitutional:  Negative for chills and fever.  Respiratory:  Negative for shortness of breath.   Cardiovascular:  Negative for chest pain.  Gastrointestinal:  Positive for constipation. Negative for abdominal pain, blood in stool, diarrhea, nausea and vomiting.       BM daily   Genitourinary:  Negative for dysuria and hematuria.  Neurological:  Positive for tingling (feels like socksbunch up). Negative for headaches.  Psychiatric/Behavioral:  Negative for hallucinations and suicidal ideas.       Objective:      BP 122/78   Pulse 63   Ht 5' 8.5" (1.74 m)   Wt 188 lb (85.3 kg)   SpO2 99%   BMI 28.17 kg/m    Physical Exam Vitals and nursing note reviewed.  Constitutional:      Appearance: Normal appearance.  HENT:     Right Ear: Tympanic membrane, ear canal and external ear normal.     Left Ear: Tympanic membrane, ear canal and external ear normal.     Mouth/Throat:     Mouth: Mucous membranes are moist.  Eyes:     Extraocular Movements: Extraocular movements intact.     Pupils: Pupils are equal, round, and reactive to light.     Comments: Wears glasses   Cardiovascular:     Rate and Rhythm: Normal rate and regular rhythm.     Heart sounds: Normal heart sounds.  Pulmonary:     Effort: Pulmonary effort is normal.     Breath sounds: Normal breath sounds.  Abdominal:     General: Bowel sounds are normal. There is no distension.     Palpations: There is no mass.     Tenderness: There is no abdominal tenderness.     Hernia: No hernia is present.  Musculoskeletal:       Arms:     Lumbar back: Bony tenderness present. No tenderness.     Right lower leg: No edema.     Left lower leg: No edema.  Lymphadenopathy:  Cervical: No cervical adenopathy.  Skin:    General: Skin is warm.  Neurological:     Mental Status: She is alert.     Deep Tendon Reflexes:     Reflex Scores:      Bicep reflexes are 2+ on the right side and 2+ on the left side.      Patellar reflexes are 2+ on the right side and 2+ on the left side.    Comments: Bilateral upper and lower extremity strength 5/5  Psychiatric:        Mood and Affect: Mood normal.        Behavior: Behavior normal.        Thought Content: Thought content normal.        Judgment: Judgment normal.      No results found for any visits on 09/07/22.    The 10-year ASCVD risk score (Arnett DK, et al., 2019) is: 3.5%    Assessment & Plan:   Problem List Items Addressed This Visit       Digestive   GERD (gastroesophageal  reflux disease)    Patient currently on PPI therapy and followed by GI.  Continue taking medication as prescribed follow-up with GI as recommended        Other   Trouble in sleeping    Did well with hydroxyzine 50 mg.  Will continue that medication.  Refill sent today      Relevant Medications   hydrOXYzine (VISTARIL) 50 MG capsule   Right-sided low back pain without sciatica    Will use Flexeril as needed.  Would like a refill, refill provided.  Will obtain lumbar picture, pending result      Relevant Medications   cyclobenzaprine (FLEXERIL) 5 MG tablet   Other Relevant Orders   DG Lumbar Spine Complete   Neck pain    Will use Flexeril on a as needed basis.  Refill provided today      Relevant Medications   cyclobenzaprine (FLEXERIL) 5 MG tablet   Preventative health care - Primary    Discussed age-appropriate medications and screening exams.  Patient needs to follow-up with GI in regards for her CRC screening.  Mammogram order placed today.  Patient had hysterectomy does not do cervical cancer screening.  Patient was given information at discharge for preventative healthcare maintenance with anticipatory guidance for age range.      Relevant Orders   CBC   Comprehensive metabolic panel   Hemoglobin A1c   TSH   Overweight    Patient is getting adequate amount of exercise continue      Relevant Orders   Hemoglobin A1c   Lipid panel   Paresthesia    States he is having some paresthesias to bilateral plantar surface of feet.  Feels like "bunched up socks".  Check B12 level today she does take over-the-counter oral B12      Relevant Orders   Vitamin B12   Other Visit Diagnoses     Need for influenza vaccination       Need for immunization against influenza       Relevant Orders   Flu Vaccine QUAD 30moIM (Fluarix, Fluzone & Alfiuria Quad PF) (Completed)   Encounter for screening mammogram for malignant neoplasm of breast       Relevant Orders   MM Digital  Screening       Return in about 1 year (around 09/08/2023) for CPe and labs.    MRomilda Garret NP

## 2022-09-07 NOTE — Assessment & Plan Note (Signed)
Did well with hydroxyzine 50 mg.  Will continue that medication.  Refill sent today

## 2022-09-07 NOTE — Patient Instructions (Signed)
Nice to see you today I will be in touch with the labs once I have them Follow up with me in 1 year for your next physical, sooner if you need me

## 2022-09-07 NOTE — Assessment & Plan Note (Signed)
Patient currently on PPI therapy and followed by GI.  Continue taking medication as prescribed follow-up with GI as recommended

## 2022-09-07 NOTE — Assessment & Plan Note (Signed)
Patient is getting adequate amount of exercise continue

## 2022-09-21 DIAGNOSIS — H35413 Lattice degeneration of retina, bilateral: Secondary | ICD-10-CM | POA: Diagnosis not present

## 2022-09-21 DIAGNOSIS — H35371 Puckering of macula, right eye: Secondary | ICD-10-CM | POA: Diagnosis not present

## 2022-09-21 DIAGNOSIS — H43391 Other vitreous opacities, right eye: Secondary | ICD-10-CM | POA: Diagnosis not present

## 2022-09-21 DIAGNOSIS — H43811 Vitreous degeneration, right eye: Secondary | ICD-10-CM | POA: Diagnosis not present

## 2022-10-18 DIAGNOSIS — H43391 Other vitreous opacities, right eye: Secondary | ICD-10-CM | POA: Diagnosis not present

## 2022-10-18 DIAGNOSIS — H35361 Drusen (degenerative) of macula, right eye: Secondary | ICD-10-CM | POA: Diagnosis not present

## 2022-10-18 DIAGNOSIS — H35413 Lattice degeneration of retina, bilateral: Secondary | ICD-10-CM | POA: Diagnosis not present

## 2022-10-18 DIAGNOSIS — H43813 Vitreous degeneration, bilateral: Secondary | ICD-10-CM | POA: Diagnosis not present

## 2022-10-28 ENCOUNTER — Other Ambulatory Visit: Payer: Self-pay | Admitting: Nurse Practitioner

## 2022-10-28 DIAGNOSIS — M542 Cervicalgia: Secondary | ICD-10-CM

## 2022-10-29 ENCOUNTER — Other Ambulatory Visit: Payer: Self-pay

## 2022-10-29 DIAGNOSIS — M542 Cervicalgia: Secondary | ICD-10-CM

## 2022-10-29 MED ORDER — CYCLOBENZAPRINE HCL 5 MG PO TABS: 5.0000 mg | ORAL_TABLET | Freq: Two times a day (BID) | ORAL | 0 refills | Status: DC | PRN

## 2022-10-29 NOTE — Telephone Encounter (Signed)
Rx refill request sent to provider.

## 2022-10-29 NOTE — Telephone Encounter (Signed)
Last visit 09/07/2022 Next per matt follow up in 1 year.

## 2022-10-30 ENCOUNTER — Encounter: Payer: Self-pay | Admitting: Nurse Practitioner

## 2022-11-17 DIAGNOSIS — H43311 Vitreous membranes and strands, right eye: Secondary | ICD-10-CM | POA: Diagnosis not present

## 2022-11-17 DIAGNOSIS — H43811 Vitreous degeneration, right eye: Secondary | ICD-10-CM | POA: Diagnosis not present

## 2022-11-17 DIAGNOSIS — H33331 Multiple defects of retina without detachment, right eye: Secondary | ICD-10-CM | POA: Diagnosis not present

## 2022-11-17 DIAGNOSIS — H43391 Other vitreous opacities, right eye: Secondary | ICD-10-CM | POA: Diagnosis not present

## 2022-11-18 DIAGNOSIS — H43391 Other vitreous opacities, right eye: Secondary | ICD-10-CM | POA: Diagnosis not present

## 2022-11-18 DIAGNOSIS — H33331 Multiple defects of retina without detachment, right eye: Secondary | ICD-10-CM | POA: Diagnosis not present

## 2022-11-18 DIAGNOSIS — Z9889 Other specified postprocedural states: Secondary | ICD-10-CM | POA: Diagnosis not present

## 2022-11-24 ENCOUNTER — Other Ambulatory Visit: Payer: Self-pay | Admitting: Nurse Practitioner

## 2022-11-24 DIAGNOSIS — M542 Cervicalgia: Secondary | ICD-10-CM

## 2022-11-25 DIAGNOSIS — H43391 Other vitreous opacities, right eye: Secondary | ICD-10-CM | POA: Diagnosis not present

## 2022-11-25 DIAGNOSIS — H33331 Multiple defects of retina without detachment, right eye: Secondary | ICD-10-CM | POA: Diagnosis not present

## 2022-11-25 DIAGNOSIS — Z9889 Other specified postprocedural states: Secondary | ICD-10-CM | POA: Diagnosis not present

## 2022-11-25 MED ORDER — CYCLOBENZAPRINE HCL 5 MG PO TABS: 5.0000 mg | ORAL_TABLET | Freq: Two times a day (BID) | ORAL | 0 refills | Status: DC | PRN

## 2022-12-17 ENCOUNTER — Ambulatory Visit (INDEPENDENT_AMBULATORY_CARE_PROVIDER_SITE_OTHER): Payer: BC Managed Care – PPO | Admitting: Nurse Practitioner

## 2022-12-17 ENCOUNTER — Encounter: Payer: Self-pay | Admitting: Nurse Practitioner

## 2022-12-17 VITALS — BP 120/66 | HR 83 | Temp 98.0°F | Resp 16 | Ht 68.5 in | Wt 193.1 lb

## 2022-12-17 DIAGNOSIS — F99 Mental disorder, not otherwise specified: Secondary | ICD-10-CM

## 2022-12-17 DIAGNOSIS — F419 Anxiety disorder, unspecified: Secondary | ICD-10-CM | POA: Diagnosis not present

## 2022-12-17 DIAGNOSIS — M542 Cervicalgia: Secondary | ICD-10-CM

## 2022-12-17 DIAGNOSIS — F5105 Insomnia due to other mental disorder: Secondary | ICD-10-CM | POA: Diagnosis not present

## 2022-12-17 MED ORDER — CYCLOBENZAPRINE HCL 5 MG PO TABS: 5.0000 mg | ORAL_TABLET | Freq: Two times a day (BID) | ORAL | 0 refills | Status: DC | PRN

## 2022-12-17 MED ORDER — SERTRALINE HCL 25 MG PO TABS: ORAL_TABLET | ORAL | 0 refills | Status: DC

## 2022-12-17 NOTE — Assessment & Plan Note (Signed)
Patient tried Effexor, Paxil, Prozac and therapy in the past.  Patient also tried trazodone and hydroxyzine to help with sleep.  Hydroxyzine does help some trazodone might help at all.  Did discuss the possibility of mirtazapine but patient would like to hold off due to the possibility of weight gain.  Will start patient on sertraline 25 mg titrating up to 50 mg in 2 weeks.  Follow-up 6 weeks. Patient denies HI/SI/AVH.

## 2022-12-17 NOTE — Assessment & Plan Note (Signed)
Patient has difficulty sleeping secondary to anxiety it seems.  Will treat patient's anxiety and see if this.  Her sleeping habits

## 2022-12-17 NOTE — Patient Instructions (Signed)
Nice to see you today  We will continue the other medications I have added on sertraline (Zoloft). Follow up in 6 weeks with me, sooner if you need me

## 2022-12-17 NOTE — Progress Notes (Signed)
Acute Office Visit  Subjective:     Patient ID: Katie Townsend, female    DOB: 1961/08/25, 62 y.o.   MRN: [id number]  Chief Complaint  Patient presents with   Anxiety    When she goes to bed at night her mind races.    Anxiety Symptoms include insomnia. Patient reports no nervous/anxious behavior or suicidal ideas.     Patient is in today for anxiety with a history of GERD, sciatica, and anxiety  Patient has tried therapy and Effexor in the past without great relief.  We have trialed patient on hydroxyzine to see if it helps with anxiety and sleep disturbances.  She has mentioned she is taking adrenal as needed.  Patient is here today for anxiety and sleep disturbance.  States that she is still using the hydroxzyine and it helps sometimes . States that when you lay down and her mind races. States that she does have trouble going to sleep States that she lost her sister in October and had to put her cat down.  States that she has had trouble over the past few  Goes to bed around 9a nd will get up at 5. States sometimes she will get 8 hours of sleep but  States she has taken paxil, prozac, and   Review of Systems  Constitutional:  Negative for chills and fever.  Psychiatric/Behavioral:  Negative for hallucinations and suicidal ideas. The patient has insomnia. The patient is not nervous/anxious.         Objective:    BP 120/66   Pulse 83   Temp 98 F (36.7 C)   Resp 16   Ht 5' 8.5" (1.74 m)   Wt 193 lb 2 oz (87.6 kg)   SpO2 98%   BMI 28.94 kg/m  BP Readings from Last 3 Encounters:  12/17/22 120/66  09/07/22 122/78  03/14/22 133/82   Wt Readings from Last 3 Encounters:  12/17/22 193 lb 2 oz (87.6 kg)  09/07/22 188 lb (85.3 kg)  11/06/21 188 lb 2 oz (85.3 kg)      Physical Exam Vitals and nursing note reviewed.  Constitutional:      Appearance: Normal appearance.  Cardiovascular:     Rate and Rhythm: Normal rate and regular rhythm.     Heart sounds:  Normal heart sounds.  Pulmonary:     Effort: Pulmonary effort is normal.     Breath sounds: Normal breath sounds.  Neurological:     Mental Status: She is alert.     No results found for any visits on 12/17/22.      Assessment & Plan:   Problem List Items Addressed This Visit       Other   Anxiety - Primary    Patient tried Effexor, Paxil, Prozac and therapy in the past.  Patient also tried trazodone and hydroxyzine to help with sleep.  Hydroxyzine does help some trazodone might help at all.  Did discuss the possibility of mirtazapine but patient would like to hold off due to the possibility of weight gain.  Will start patient on sertraline 25 mg titrating up to 50 mg in 2 weeks.  Follow-up 6 weeks. Patient denies HI/SI/AVH.      Relevant Medications   sertraline (ZOLOFT) 25 MG tablet   Insomnia due to other mental disorder    Patient has difficulty sleeping secondary to anxiety it seems.  Will treat patient's anxiety and see if this.  Her sleeping habits      Neck  pain   Relevant Medications   cyclobenzaprine (FLEXERIL) 5 MG tablet    Meds ordered this encounter  Medications   sertraline (ZOLOFT) 25 MG tablet    Sig: Take 1 tablet (25 mg total) by mouth daily for 15 days, THEN 2 tablets (50 mg total) daily for 15 days.    Dispense:  45 tablet    Refill:  0    Order Specific Question:   Supervising Provider    Answer:   Milinda Antis, MARNE A [[id number]]   cyclobenzaprine (FLEXERIL) 5 MG tablet    Sig: Take 1 tablet (5 mg total) by mouth 2 (two) times daily as needed for muscle spasms.    Dispense:  60 tablet    Refill:  0    Order Specific Question:   Supervising Provider    Answer:   Roxy Manns A [[id number]]    Return in about 6 weeks (around 01/28/2023) for MDD/GAD medicatoin recheck .  Audria Nine, NP

## 2022-12-18 ENCOUNTER — Encounter: Payer: Self-pay | Admitting: Nurse Practitioner

## 2022-12-27 DIAGNOSIS — Z9889 Other specified postprocedural states: Secondary | ICD-10-CM | POA: Diagnosis not present

## 2022-12-27 DIAGNOSIS — H43391 Other vitreous opacities, right eye: Secondary | ICD-10-CM | POA: Diagnosis not present

## 2022-12-27 DIAGNOSIS — H33331 Multiple defects of retina without detachment, right eye: Secondary | ICD-10-CM | POA: Diagnosis not present

## 2023-01-10 ENCOUNTER — Other Ambulatory Visit: Payer: Self-pay | Admitting: Nurse Practitioner

## 2023-01-10 DIAGNOSIS — F5105 Insomnia due to other mental disorder: Secondary | ICD-10-CM

## 2023-01-10 DIAGNOSIS — F419 Anxiety disorder, unspecified: Secondary | ICD-10-CM

## 2023-01-10 MED ORDER — SERTRALINE HCL 50 MG PO TABS: 50.0000 mg | ORAL_TABLET | Freq: Every day | ORAL | 0 refills | Status: DC

## 2023-01-20 ENCOUNTER — Other Ambulatory Visit: Payer: Self-pay | Admitting: Nurse Practitioner

## 2023-01-20 DIAGNOSIS — M542 Cervicalgia: Secondary | ICD-10-CM

## 2023-01-21 MED ORDER — CYCLOBENZAPRINE HCL 5 MG PO TABS: 5.0000 mg | ORAL_TABLET | Freq: Two times a day (BID) | ORAL | 2 refills | Status: AC | PRN

## 2023-01-28 ENCOUNTER — Ambulatory Visit (INDEPENDENT_AMBULATORY_CARE_PROVIDER_SITE_OTHER): Payer: BC Managed Care – PPO | Admitting: Nurse Practitioner

## 2023-01-28 ENCOUNTER — Encounter: Payer: Self-pay | Admitting: Nurse Practitioner

## 2023-01-28 VITALS — BP 134/66 | HR 75 | Temp 97.8°F | Resp 16 | Ht 68.5 in | Wt 192.5 lb

## 2023-01-28 DIAGNOSIS — H9312 Tinnitus, left ear: Secondary | ICD-10-CM | POA: Diagnosis not present

## 2023-01-28 DIAGNOSIS — F419 Anxiety disorder, unspecified: Secondary | ICD-10-CM

## 2023-01-28 NOTE — Assessment & Plan Note (Signed)
Likely secondary to lab music.  Ear exam benign in office.  She is okay doing watchful waiting.  She would like to be evaluated by ENT she will reach out via MyChart.

## 2023-01-28 NOTE — Patient Instructions (Addendum)
Nice to see you today Ears looked ok on exam. If you have want to see an ENT let me know I want to see you in approx 8 months for your next physical with labs

## 2023-01-28 NOTE — Progress Notes (Signed)
Established Patient Office Visit  Subjective   Patient ID: Katie Townsend, female    DOB: 01-30-1961  Age: 62 y.o. MRN: [id number]  Chief Complaint  Patient presents with   Anxiety      Anxiety: patinet was seen by me on 12/17/2022 for anxiety and she was started on zoloft. States hat she is doing better and feels less stressed   States that her sleep is still up down regardless of the medication   Tinnitus: states that it has been a couple months. No injury to the ear. States that it was before the anxiety medications. States that she did go to a concert that was loud and felt like it worsened after that. No physical trauma to the ear per patient report      01/28/2023    2:37 PM 12/17/2022    3:18 PM 09/03/2021    9:04 AM  PHQ9 SCORE ONLY  PHQ-9 Total Score 3 11 8        01/28/2023    2:38 PM 12/17/2022    3:18 PM 06/01/2021   12:31 PM  GAD 7 : Generalized Anxiety Score  Nervous, Anxious, on Edge 0 2 2  Control/stop worrying 0 2 1  Worry too much - different things 1 3 1   Trouble relaxing 0 2 1  Restless 0 0 0  Easily annoyed or irritable 0 1 0  Afraid - awful might happen 1 0 1  Total GAD 7 Score 2 10 6   Anxiety Difficulty Not difficult at all Somewhat difficult Somewhat difficult       Review of Systems  Constitutional:  Negative for chills and fever.  Respiratory:  Negative for shortness of breath.   Cardiovascular:  Negative for chest pain.  Neurological:  Negative for headaches.  Psychiatric/Behavioral:  Negative for hallucinations and suicidal ideas. The patient has insomnia.       Objective:     BP 134/66   Pulse 75   Temp 97.8 F (36.6 C)   Resp 16   Ht 5' 8.5" (1.74 m)   Wt 192 lb 8 oz (87.3 kg)   SpO2 98%   BMI 28.84 kg/m  BP Readings from Last 3 Encounters:  01/28/23 134/66  12/17/22 120/66  09/07/22 122/78   Wt Readings from Last 3 Encounters:  01/28/23 192 lb 8 oz (87.3 kg)  12/17/22 193 lb 2 oz (87.6 kg)  09/07/22 188 lb (85.3 kg)       Physical Exam Vitals and nursing note reviewed.  Constitutional:      Appearance: Normal appearance.  HENT:     Right Ear: Tympanic membrane, ear canal and external ear normal.     Left Ear: Tympanic membrane, ear canal and external ear normal.  Cardiovascular:     Rate and Rhythm: Normal rate and regular rhythm.     Heart sounds: Normal heart sounds.  Pulmonary:     Effort: Pulmonary effort is normal.     Breath sounds: Normal breath sounds.  Neurological:     Mental Status: She is alert.      No results found for any visits on 01/28/23.    The 10-year ASCVD risk score (Arnett DK, et al., 2019) is: 4%    Assessment & Plan:   Problem List Items Addressed This Visit       Other   Anxiety - Primary    Patient currently maintained on Zoloft 50 mg daily.  Tolerating medication well.  Patient denies HI/SI/AVH.  States that  she is been doing better on medication we will continue at current regimen      Tinnitus of left ear    Likely secondary to lab music.  Ear exam benign in office.  She is okay doing watchful waiting.  She would like to be evaluated by ENT she will reach out via MyChart.       Return in about 8 months (around 09/30/2023) for CPE and Labs.    Audria Nine, NP

## 2023-01-28 NOTE — Assessment & Plan Note (Signed)
Patient currently maintained on Zoloft 50 mg daily.  Tolerating medication well.  Patient denies HI/SI/AVH.  States that she is been doing better on medication we will continue at current regimen

## 2023-03-05 ENCOUNTER — Other Ambulatory Visit: Payer: Self-pay | Admitting: Nurse Practitioner

## 2023-03-05 DIAGNOSIS — G479 Sleep disorder, unspecified: Secondary | ICD-10-CM

## 2023-03-21 DIAGNOSIS — H43822 Vitreomacular adhesion, left eye: Secondary | ICD-10-CM | POA: Diagnosis not present

## 2023-03-21 DIAGNOSIS — H35371 Puckering of macula, right eye: Secondary | ICD-10-CM | POA: Diagnosis not present

## 2023-03-21 DIAGNOSIS — H35412 Lattice degeneration of retina, left eye: Secondary | ICD-10-CM | POA: Diagnosis not present

## 2023-03-21 DIAGNOSIS — H43812 Vitreous degeneration, left eye: Secondary | ICD-10-CM | POA: Diagnosis not present

## 2023-04-09 ENCOUNTER — Other Ambulatory Visit: Payer: Self-pay | Admitting: Nurse Practitioner

## 2023-04-09 DIAGNOSIS — F419 Anxiety disorder, unspecified: Secondary | ICD-10-CM

## 2023-04-12 DIAGNOSIS — H18413 Arcus senilis, bilateral: Secondary | ICD-10-CM | POA: Diagnosis not present

## 2023-04-12 DIAGNOSIS — H43822 Vitreomacular adhesion, left eye: Secondary | ICD-10-CM | POA: Diagnosis not present

## 2023-04-12 DIAGNOSIS — H2513 Age-related nuclear cataract, bilateral: Secondary | ICD-10-CM | POA: Diagnosis not present

## 2023-04-12 DIAGNOSIS — H35412 Lattice degeneration of retina, left eye: Secondary | ICD-10-CM | POA: Diagnosis not present

## 2023-04-12 DIAGNOSIS — H25043 Posterior subcapsular polar age-related cataract, bilateral: Secondary | ICD-10-CM | POA: Diagnosis not present

## 2023-04-12 DIAGNOSIS — H2511 Age-related nuclear cataract, right eye: Secondary | ICD-10-CM | POA: Diagnosis not present

## 2023-04-12 DIAGNOSIS — H35371 Puckering of macula, right eye: Secondary | ICD-10-CM | POA: Diagnosis not present

## 2023-05-30 DIAGNOSIS — H35412 Lattice degeneration of retina, left eye: Secondary | ICD-10-CM | POA: Diagnosis not present

## 2023-06-06 ENCOUNTER — Other Ambulatory Visit: Payer: Self-pay | Admitting: Nurse Practitioner

## 2023-06-06 DIAGNOSIS — G479 Sleep disorder, unspecified: Secondary | ICD-10-CM

## 2023-06-20 DIAGNOSIS — H2511 Age-related nuclear cataract, right eye: Secondary | ICD-10-CM | POA: Diagnosis not present

## 2023-06-21 DIAGNOSIS — H2512 Age-related nuclear cataract, left eye: Secondary | ICD-10-CM | POA: Diagnosis not present

## 2023-07-11 DIAGNOSIS — H2512 Age-related nuclear cataract, left eye: Secondary | ICD-10-CM | POA: Diagnosis not present

## 2023-08-08 DIAGNOSIS — H31093 Other chorioretinal scars, bilateral: Secondary | ICD-10-CM | POA: Diagnosis not present

## 2023-08-08 DIAGNOSIS — H35412 Lattice degeneration of retina, left eye: Secondary | ICD-10-CM | POA: Diagnosis not present

## 2023-08-08 DIAGNOSIS — H35371 Puckering of macula, right eye: Secondary | ICD-10-CM | POA: Diagnosis not present

## 2023-08-08 DIAGNOSIS — H43812 Vitreous degeneration, left eye: Secondary | ICD-10-CM | POA: Diagnosis not present

## 2023-08-20 DIAGNOSIS — H33312 Horseshoe tear of retina without detachment, left eye: Secondary | ICD-10-CM | POA: Diagnosis not present

## 2023-08-20 DIAGNOSIS — H43812 Vitreous degeneration, left eye: Secondary | ICD-10-CM | POA: Diagnosis not present

## 2023-08-20 DIAGNOSIS — H35412 Lattice degeneration of retina, left eye: Secondary | ICD-10-CM | POA: Diagnosis not present

## 2023-08-20 DIAGNOSIS — H4312 Vitreous hemorrhage, left eye: Secondary | ICD-10-CM | POA: Diagnosis not present

## 2023-08-25 DIAGNOSIS — H4312 Vitreous hemorrhage, left eye: Secondary | ICD-10-CM | POA: Diagnosis not present

## 2023-08-25 DIAGNOSIS — H43812 Vitreous degeneration, left eye: Secondary | ICD-10-CM | POA: Diagnosis not present

## 2023-08-26 ENCOUNTER — Other Ambulatory Visit: Payer: Self-pay | Admitting: Nurse Practitioner

## 2023-08-26 DIAGNOSIS — G479 Sleep disorder, unspecified: Secondary | ICD-10-CM

## 2023-08-26 MED ORDER — HYDROXYZINE PAMOATE 50 MG PO CAPS: 50.0000 mg | ORAL_CAPSULE | Freq: Every day | ORAL | 0 refills | Status: DC

## 2023-09-19 DIAGNOSIS — H35371 Puckering of macula, right eye: Secondary | ICD-10-CM | POA: Diagnosis not present

## 2023-09-19 DIAGNOSIS — H43392 Other vitreous opacities, left eye: Secondary | ICD-10-CM | POA: Diagnosis not present

## 2023-09-19 DIAGNOSIS — H31093 Other chorioretinal scars, bilateral: Secondary | ICD-10-CM | POA: Diagnosis not present

## 2023-09-19 DIAGNOSIS — H43812 Vitreous degeneration, left eye: Secondary | ICD-10-CM | POA: Diagnosis not present

## 2023-10-14 DIAGNOSIS — Z1231 Encounter for screening mammogram for malignant neoplasm of breast: Secondary | ICD-10-CM | POA: Diagnosis not present

## 2023-10-14 LAB — HM MAMMOGRAPHY

## 2023-10-19 ENCOUNTER — Encounter: Payer: Self-pay | Admitting: Nurse Practitioner

## 2023-11-30 ENCOUNTER — Other Ambulatory Visit: Payer: Self-pay | Admitting: Nurse Practitioner

## 2023-11-30 DIAGNOSIS — G479 Sleep disorder, unspecified: Secondary | ICD-10-CM

## 2024-02-27 ENCOUNTER — Other Ambulatory Visit: Payer: Self-pay | Admitting: Nurse Practitioner

## 2024-02-27 DIAGNOSIS — G479 Sleep disorder, unspecified: Secondary | ICD-10-CM

## 2024-03-16 ENCOUNTER — Encounter: Payer: Self-pay | Admitting: Nurse Practitioner

## 2024-03-16 ENCOUNTER — Ambulatory Visit (INDEPENDENT_AMBULATORY_CARE_PROVIDER_SITE_OTHER): Admitting: Nurse Practitioner

## 2024-03-16 VITALS — BP 114/82 | HR 61 | Temp 98.4°F | Ht 68.0 in | Wt 203.0 lb

## 2024-03-16 DIAGNOSIS — Z1211 Encounter for screening for malignant neoplasm of colon: Secondary | ICD-10-CM

## 2024-03-16 DIAGNOSIS — F99 Mental disorder, not otherwise specified: Secondary | ICD-10-CM | POA: Diagnosis not present

## 2024-03-16 DIAGNOSIS — Z79899 Other long term (current) drug therapy: Secondary | ICD-10-CM | POA: Diagnosis not present

## 2024-03-16 DIAGNOSIS — E7800 Pure hypercholesterolemia, unspecified: Secondary | ICD-10-CM | POA: Insufficient documentation

## 2024-03-16 DIAGNOSIS — K219 Gastro-esophageal reflux disease without esophagitis: Secondary | ICD-10-CM

## 2024-03-16 DIAGNOSIS — R7303 Prediabetes: Secondary | ICD-10-CM | POA: Insufficient documentation

## 2024-03-16 DIAGNOSIS — F5105 Insomnia due to other mental disorder: Secondary | ICD-10-CM | POA: Diagnosis not present

## 2024-03-16 DIAGNOSIS — Z0000 Encounter for general adult medical examination without abnormal findings: Secondary | ICD-10-CM | POA: Diagnosis not present

## 2024-03-16 DIAGNOSIS — R202 Paresthesia of skin: Secondary | ICD-10-CM | POA: Diagnosis not present

## 2024-03-16 DIAGNOSIS — F419 Anxiety disorder, unspecified: Secondary | ICD-10-CM

## 2024-03-16 DIAGNOSIS — Z23 Encounter for immunization: Secondary | ICD-10-CM

## 2024-03-16 NOTE — Progress Notes (Signed)
 Established Patient Office Visit  Subjective   Patient ID: Katie Townsend, female    DOB: Jan 14, 1961  Age: 63 y.o. MRN: [id number]  Chief Complaint  Patient presents with   Annual Exam    HPI  Anxiety: Patient currently maintained on sertraline  50 mg daily. States that it does well   Insomnia: Patient currently maintained on hydroxyzine  50 mg nightly. States that she is doing ok with sleep. States that last ngiht was 6 hours and then the night before was 4.  GERD: Patient currently maintained on esomeprazole  20 mg daily and famotidine  20 mg nightly as needed.  for complete physical and follow up of chronic conditions.  Immunizations: -Tetanus: Completed in 2014, needs updating -Influenza: Out of season -Shingles:discussed  -Pneumonia: Too young  Diet: Fair diet. She is eating 3 meals and snakcs. She is doing coffee water and tea. States tea is diluted with water Exercise: No regular exercise. States that she is getting some. Some walking   Eye exam: Completes annually. Glasses  Dental exam: Completes semi-annually    Colonoscopy: burling t Lung Cancer Screening: Completed in   Pap Smear: Hysterectomy  Mammogram: 10/14/2023 negative  DEXA: Too young         Review of Systems  Constitutional:  Negative for chills and fever.  Respiratory:  Negative for shortness of breath.   Cardiovascular:  Negative for chest pain and leg swelling.  Gastrointestinal:  Negative for abdominal pain, blood in stool, constipation, diarrhea, nausea and vomiting.       BM daily   Genitourinary:  Negative for dysuria and hematuria.  Neurological:  Positive for tingling (middle of back that is intermittent). Negative for dizziness and headaches.  Psychiatric/Behavioral:  Negative for hallucinations and suicidal ideas.       Objective:     BP 114/82   Pulse 61   Temp 98.4 F (36.9 C) (Oral)   Ht 5' 8 (1.727 m)   Wt 203 lb (92.1 kg)   SpO2 95%   BMI 30.87 kg/m  BP Readings  from Last 3 Encounters:  03/16/24 114/82  01/28/23 134/66  12/17/22 120/66   Wt Readings from Last 3 Encounters:  03/16/24 203 lb (92.1 kg)  01/28/23 192 lb 8 oz (87.3 kg)  12/17/22 193 lb 2 oz (87.6 kg)   SpO2 Readings from Last 3 Encounters:  03/16/24 95%  01/28/23 98%  12/17/22 98%      Physical Exam Vitals and nursing note reviewed.  Constitutional:      Appearance: Normal appearance.  HENT:     Right Ear: Tympanic membrane, ear canal and external ear normal.     Left Ear: Tympanic membrane, ear canal and external ear normal.     Mouth/Throat:     Mouth: Mucous membranes are moist.     Pharynx: Oropharynx is clear.  Eyes:     Extraocular Movements: Extraocular movements intact.     Pupils: Pupils are equal, round, and reactive to light.  Cardiovascular:     Rate and Rhythm: Normal rate and regular rhythm.     Pulses: Normal pulses.     Heart sounds: Normal heart sounds.  Pulmonary:     Effort: Pulmonary effort is normal.     Breath sounds: Normal breath sounds.  Abdominal:     General: Bowel sounds are normal. There is no distension.     Palpations: There is no mass.     Tenderness: There is no abdominal tenderness.     Hernia: No  hernia is present.  Musculoskeletal:     Right lower leg: No edema.     Left lower leg: No edema.  Lymphadenopathy:     Cervical: No cervical adenopathy.  Skin:    General: Skin is warm.  Neurological:     General: No focal deficit present.     Mental Status: She is alert.     Deep Tendon Reflexes:     Reflex Scores:      Bicep reflexes are 2+ on the right side and 2+ on the left side.      Patellar reflexes are 2+ on the right side and 2+ on the left side.    Comments: Bilateral upper and lower extremity strength 5/5  Psychiatric:        Mood and Affect: Mood normal.        Behavior: Behavior normal.        Thought Content: Thought content normal.        Judgment: Judgment normal.      No results found for any visits on  03/16/24.    The 10-year ASCVD risk score (Arnett DK, et al., 2019) is: 3.2%    Assessment & Plan:   Problem List Items Addressed This Visit       Digestive   GERD (gastroesophageal reflux disease)   Patient currently maintained on esomeprazole  20 mg daily and Pepcid  20 mg as needed.  Will check magnesium  and B12 today        Other   Anxiety   Currently maintained on sertraline  50 mg daily.  Patient [place] HI/SI/AVH.  Continue medication as prescribed      Relevant Orders   TSH   Insomnia due to other mental disorder   Patient currently maintained on hydroxyzine  50 mg nightly.  Stable      Relevant Orders   TSH   Preventative health care - Primary   Discussed age-appropriate immunizations and screening exams.  Did review patient's personal, surgical, social, family histories.  Patient is up-to-date with all age-appropriate vaccinations she would like.  He did discuss shingles vaccine in office.  Update tetanus vaccine today.  Ambulatory referral to GI for CRC screening.  Patient is up-to-date on breast cancer screening.  Patient is no longer a candidate for cervical cancer screening.  Patient was given information at discharge about preventative healthcare maintenance with anticipatory guidance.      Relevant Orders   CBC   Comprehensive metabolic panel with GFR   TSH   Paresthesia   Given long-term PPI use we will check B12 and magnesium  today      Relevant Orders   Vitamin B12   Elevated LDL cholesterol level   Pending lipid panel today.      Relevant Orders   Hemoglobin A1c   Lipid panel   Prediabetes   Pending A1c today.      Relevant Orders   Hemoglobin A1c   Lipid panel   Other Visit Diagnoses       Need for diphtheria-tetanus-pertussis (Tdap) vaccine       Relevant Orders   Tdap vaccine greater than or equal to 7yo IM (Completed)     Screening for colon cancer       Relevant Orders   Ambulatory referral to Gastroenterology     Long-term current  use of proton pump inhibitor therapy       Relevant Orders   Vitamin B12   Magnesium        Return in about 1  year (around 03/16/2025) for CPE and Labs.    Adina Crandall, NP

## 2024-03-16 NOTE — Assessment & Plan Note (Signed)
Pending A1c today 

## 2024-03-16 NOTE — Assessment & Plan Note (Signed)
 Patient currently maintained on hydroxyzine  50 mg nightly.  Stable

## 2024-03-16 NOTE — Assessment & Plan Note (Signed)
 Currently maintained on sertraline  50 mg daily.  Patient Nuys HI/SI/AVH.  Continue medication as prescribed

## 2024-03-16 NOTE — Assessment & Plan Note (Signed)
 Discussed age-appropriate immunizations and screening exams.  Did review patient's personal, surgical, social, family histories.  Patient is up-to-date with all age-appropriate vaccinations she would like.  He did discuss shingles vaccine in office.  Update tetanus vaccine today.  Ambulatory referral to GI for CRC screening.  Patient is up-to-date on breast cancer screening.  Patient is no longer a candidate for cervical cancer screening.  Patient was given information at discharge about preventative healthcare maintenance with anticipatory guidance.

## 2024-03-16 NOTE — Assessment & Plan Note (Signed)
Pending lipid panel today 

## 2024-03-16 NOTE — Assessment & Plan Note (Signed)
 Patient currently maintained on esomeprazole  20 mg daily and Pepcid  20 mg as needed.  Will check magnesium  and B12 today

## 2024-03-16 NOTE — Patient Instructions (Signed)
 Nice to see you today Consider getting the shingles vaccine We did up date your tetanus vaccine today I will be in touch with the labs once I have them  Dr. Watt is the sports medicine doctor in office

## 2024-03-16 NOTE — Assessment & Plan Note (Signed)
 Given long-term PPI use we will check B12 and magnesium  today

## 2024-03-17 LAB — LIPID PANEL
Cholesterol: 228 mg/dL — ABNORMAL HIGH
HDL: 43 mg/dL — ABNORMAL LOW
LDL Cholesterol (Calc): 155 mg/dL — ABNORMAL HIGH
Non-HDL Cholesterol (Calc): 185 mg/dL — ABNORMAL HIGH
Total CHOL/HDL Ratio: 5.3 (calc) — ABNORMAL HIGH
Triglycerides: 160 mg/dL — ABNORMAL HIGH

## 2024-03-17 LAB — COMPREHENSIVE METABOLIC PANEL WITH GFR
AG Ratio: 1.9 (calc) (ref 1.0–2.5)
ALT: 18 U/L (ref 6–29)
AST: 17 U/L (ref 10–35)
Albumin: 4.5 g/dL (ref 3.6–5.1)
Alkaline phosphatase (APISO): 67 U/L (ref 37–153)
BUN: 16 mg/dL (ref 7–25)
CO2: 25 mmol/L (ref 20–32)
Calcium: 9.6 mg/dL (ref 8.6–10.4)
Chloride: 103 mmol/L (ref 98–110)
Creat: 0.76 mg/dL (ref 0.50–1.05)
Globulin: 2.4 g/dL (ref 1.9–3.7)
Glucose, Bld: 91 mg/dL (ref 65–99)
Potassium: 4.2 mmol/L (ref 3.5–5.3)
Sodium: 140 mmol/L (ref 135–146)
Total Bilirubin: 1 mg/dL (ref 0.2–1.2)
Total Protein: 6.9 g/dL (ref 6.1–8.1)
eGFR: 89 mL/min/1.73m2 (ref >=60)

## 2024-03-17 LAB — VITAMIN B12: Vitamin B-12: 455 pg/mL (ref 200–1100)

## 2024-03-17 LAB — CBC
HCT: 39.9 % (ref 35.0–45.0)
Hemoglobin: 13 g/dL (ref 11.7–15.5)
MCH: 30.5 pg (ref 27.0–33.0)
MCHC: 32.6 g/dL (ref 32.0–36.0)
MCV: 93.7 fL (ref 80.0–100.0)
MPV: 10.1 fL (ref 7.5–12.5)
Platelets: 243 Thousand/uL (ref 140–400)
RBC: 4.26 Million/uL (ref 3.80–5.10)
RDW: 12.4 % (ref 11.0–15.0)
WBC: 6.8 Thousand/uL (ref 3.8–10.8)

## 2024-03-17 LAB — HEMOGLOBIN A1C
Hgb A1c MFr Bld: 5.7 % — ABNORMAL HIGH (ref <5.7)
Mean Plasma Glucose: 117 mg/dL
eAG (mmol/L): 6.5 mmol/L

## 2024-03-17 LAB — MAGNESIUM: Magnesium: 2.1 mg/dL (ref 1.5–2.5)

## 2024-03-17 LAB — TSH: TSH: 1.05 m[IU]/L (ref 0.40–4.50)

## 2024-03-21 ENCOUNTER — Ambulatory Visit: Payer: Self-pay | Admitting: Nurse Practitioner

## 2024-05-05 ENCOUNTER — Other Ambulatory Visit: Payer: Self-pay | Admitting: Nurse Practitioner

## 2024-05-05 DIAGNOSIS — F419 Anxiety disorder, unspecified: Secondary | ICD-10-CM

## 2024-05-27 ENCOUNTER — Other Ambulatory Visit: Payer: Self-pay | Admitting: Nurse Practitioner

## 2024-05-27 DIAGNOSIS — G479 Sleep disorder, unspecified: Secondary | ICD-10-CM

## 2024-06-08 ENCOUNTER — Ambulatory Visit: Payer: Self-pay

## 2024-06-08 DIAGNOSIS — D120 Benign neoplasm of cecum: Secondary | ICD-10-CM | POA: Diagnosis not present

## 2024-06-08 DIAGNOSIS — Z83719 Family history of colon polyps, unspecified: Secondary | ICD-10-CM | POA: Diagnosis not present

## 2024-06-08 DIAGNOSIS — K640 First degree hemorrhoids: Secondary | ICD-10-CM | POA: Diagnosis not present

## 2024-06-08 DIAGNOSIS — D125 Benign neoplasm of sigmoid colon: Secondary | ICD-10-CM | POA: Diagnosis not present

## 2024-06-08 DIAGNOSIS — Z1211 Encounter for screening for malignant neoplasm of colon: Secondary | ICD-10-CM | POA: Diagnosis not present

## 2024-06-08 DIAGNOSIS — K5730 Diverticulosis of large intestine without perforation or abscess without bleeding: Secondary | ICD-10-CM | POA: Diagnosis not present

## 2024-07-29 ENCOUNTER — Other Ambulatory Visit: Payer: Self-pay | Admitting: Family

## 2024-07-29 DIAGNOSIS — F419 Anxiety disorder, unspecified: Secondary | ICD-10-CM

## 2024-08-31 ENCOUNTER — Other Ambulatory Visit: Payer: Self-pay | Admitting: Nurse Practitioner

## 2024-08-31 DIAGNOSIS — G479 Sleep disorder, unspecified: Secondary | ICD-10-CM

## 2025-03-15 ENCOUNTER — Other Ambulatory Visit

## 2025-03-22 ENCOUNTER — Encounter: Admitting: Nurse Practitioner
